# Patient Record
Sex: Female | Born: 1960
Health system: Southern US, Community
[De-identification: ages and names within clinical notes are randomized; demographics above are authoritative.]

## PROBLEM LIST (undated history)

## (undated) DIAGNOSIS — M5136 Other intervertebral disc degeneration, lumbar region: Secondary | ICD-10-CM

## (undated) DIAGNOSIS — M1990 Unspecified osteoarthritis, unspecified site: Secondary | ICD-10-CM

## (undated) DIAGNOSIS — T7840XA Allergy, unspecified, initial encounter: Secondary | ICD-10-CM

## (undated) DIAGNOSIS — M5126 Other intervertebral disc displacement, lumbar region: Secondary | ICD-10-CM

## (undated) DIAGNOSIS — H9190 Unspecified hearing loss, unspecified ear: Secondary | ICD-10-CM

## (undated) DIAGNOSIS — E785 Hyperlipidemia, unspecified: Secondary | ICD-10-CM

## (undated) DIAGNOSIS — M51369 Other intervertebral disc degeneration, lumbar region without mention of lumbar back pain or lower extremity pain: Secondary | ICD-10-CM

## (undated) HISTORY — DX: Other intervertebral disc degeneration, lumbar region: M51.36

## (undated) HISTORY — DX: Other intervertebral disc displacement, lumbar region: M51.26

## (undated) HISTORY — PX: TONSILLECTOMY AND ADENOIDECTOMY: SHX28

## (undated) HISTORY — DX: Allergy, unspecified, initial encounter: T78.40XA

## (undated) HISTORY — DX: Unspecified osteoarthritis, unspecified site: M19.90

## (undated) HISTORY — DX: Hyperlipidemia, unspecified: E78.5

## (undated) HISTORY — DX: Unspecified hearing loss, unspecified ear: H91.90

## (undated) HISTORY — DX: Other intervertebral disc degeneration, lumbar region without mention of lumbar back pain or lower extremity pain: M51.369

---

## 1999-07-08 ENCOUNTER — Other Ambulatory Visit: Admission: RE | Admit: 1999-07-08 | Discharge: 1999-07-08 | Payer: Self-pay | Admitting: *Deleted

## 2000-04-20 ENCOUNTER — Encounter: Payer: Self-pay | Admitting: Emergency Medicine

## 2000-04-20 ENCOUNTER — Encounter: Admission: RE | Admit: 2000-04-20 | Discharge: 2000-04-20 | Payer: Self-pay | Admitting: Emergency Medicine

## 2000-07-21 ENCOUNTER — Other Ambulatory Visit: Admission: RE | Admit: 2000-07-21 | Discharge: 2000-07-21 | Payer: Self-pay | Admitting: *Deleted

## 2001-07-27 ENCOUNTER — Other Ambulatory Visit: Admission: RE | Admit: 2001-07-27 | Discharge: 2001-07-27 | Payer: Self-pay | Admitting: *Deleted

## 2002-07-12 ENCOUNTER — Other Ambulatory Visit: Admission: RE | Admit: 2002-07-12 | Discharge: 2002-07-12 | Payer: Self-pay | Admitting: Obstetrics and Gynecology

## 2003-08-22 ENCOUNTER — Other Ambulatory Visit: Admission: RE | Admit: 2003-08-22 | Discharge: 2003-08-22 | Payer: Self-pay | Admitting: Obstetrics and Gynecology

## 2004-03-11 ENCOUNTER — Encounter: Admission: RE | Admit: 2004-03-11 | Discharge: 2004-03-11 | Payer: Self-pay | Admitting: Emergency Medicine

## 2004-10-02 ENCOUNTER — Other Ambulatory Visit: Admission: RE | Admit: 2004-10-02 | Discharge: 2004-10-02 | Payer: Self-pay | Admitting: Obstetrics and Gynecology

## 2005-10-27 ENCOUNTER — Other Ambulatory Visit: Admission: RE | Admit: 2005-10-27 | Discharge: 2005-10-27 | Payer: Self-pay | Admitting: Obstetrics and Gynecology

## 2006-11-12 ENCOUNTER — Other Ambulatory Visit: Admission: RE | Admit: 2006-11-12 | Discharge: 2006-11-12 | Payer: Self-pay | Admitting: Obstetrics and Gynecology

## 2007-12-28 ENCOUNTER — Other Ambulatory Visit: Admission: RE | Admit: 2007-12-28 | Discharge: 2007-12-28 | Payer: Self-pay | Admitting: Obstetrics and Gynecology

## 2009-04-01 ENCOUNTER — Other Ambulatory Visit: Admission: RE | Admit: 2009-04-01 | Discharge: 2009-04-01 | Payer: Self-pay | Admitting: Obstetrics and Gynecology

## 2010-04-02 LAB — HM PAP SMEAR

## 2012-02-02 DIAGNOSIS — M1990 Unspecified osteoarthritis, unspecified site: Secondary | ICD-10-CM | POA: Insufficient documentation

## 2012-02-02 DIAGNOSIS — M533 Sacrococcygeal disorders, not elsewhere classified: Secondary | ICD-10-CM | POA: Insufficient documentation

## 2013-05-22 LAB — HM MAMMOGRAPHY

## 2013-05-23 ENCOUNTER — Telehealth: Payer: Self-pay | Admitting: *Deleted

## 2013-05-23 NOTE — Telephone Encounter (Signed)
See chart in cabinet for signature for [facility] / sue

## 2013-05-31 ENCOUNTER — Telehealth: Payer: Self-pay | Admitting: *Deleted

## 2013-05-31 NOTE — Telephone Encounter (Signed)
Did she have her follow up?  Do we have the report?

## 2013-05-31 NOTE — Telephone Encounter (Signed)
Left message on [facility] records to send or notify us if patient had additional imaging evaluation of left breast or prior mammogram had been received for additonal comparison. To fax report if has been done.

## 2013-05-31 NOTE — Telephone Encounter (Signed)
Results from [facility] on HOLD per Dr. Bryson Ha

## 2013-09-08 ENCOUNTER — Ambulatory Visit (INDEPENDENT_AMBULATORY_CARE_PROVIDER_SITE_OTHER): Payer: BC Managed Care – PPO | Admitting: Obstetrics and Gynecology

## 2013-09-08 ENCOUNTER — Ambulatory Visit: Payer: Self-pay | Admitting: Obstetrics and Gynecology

## 2013-09-08 ENCOUNTER — Encounter: Payer: Self-pay | Admitting: Obstetrics and Gynecology

## 2013-09-08 VITALS — BP 118/70 | HR 64 | Ht 66.5 in | Wt 136.5 lb

## 2013-09-08 DIAGNOSIS — Z0000 Encounter for general adult medical examination without abnormal findings: Secondary | ICD-10-CM

## 2013-09-08 DIAGNOSIS — Z01419 Encounter for gynecological examination (general) (routine) without abnormal findings: Secondary | ICD-10-CM

## 2013-09-08 LAB — POCT URINALYSIS DIPSTICK
Bilirubin, UA: NEGATIVE
Blood, UA: NEGATIVE
Ketones, UA: NEGATIVE
Leukocytes, UA: NEGATIVE
Protein, UA: NEGATIVE
pH, UA: 5

## 2013-09-08 LAB — HEMOGLOBIN, FINGERSTICK: Hemoglobin, fingerstick: 13.4 g/dL (ref 12.0–16.0)

## 2013-09-08 LAB — COMPREHENSIVE METABOLIC PANEL
ALT: 15 U/L (ref 0–35)
AST: 19 U/L (ref 0–37)
Alkaline Phosphatase: 42 U/L (ref 39–117)
CO2: 28 mEq/L (ref 19–32)
Creat: 0.81 mg/dL (ref 0.50–1.10)
Glucose, Bld: 88 mg/dL (ref 70–99)
Sodium: 137 mEq/L (ref 135–145)
Total Bilirubin: 0.6 mg/dL (ref 0.3–1.2)
Total Protein: 7.1 g/dL (ref 6.0–8.3)

## 2013-09-08 LAB — CBC
HCT: 41 % (ref 36.0–46.0)
MCH: 28.7 pg (ref 26.0–34.0)
MCHC: 33.7 g/dL (ref 30.0–36.0)
MCV: 85.2 fL (ref 78.0–100.0)
RDW: 13.7 % (ref 11.5–15.5)

## 2013-09-08 LAB — LIPID PANEL
HDL: 86 mg/dL (ref >39)
LDL Cholesterol: 122 mg/dL — ABNORMAL HIGH (ref 0–99)
Triglycerides: 91 mg/dL (ref <150)
VLDL: 18 mg/dL (ref 0–40)

## 2013-09-08 NOTE — Progress Notes (Signed)
Patient ID: Dawn Bonilla, female   DOB: [birth year], 52 y.o.   MRN: [id number] GYNECOLOGY VISIT  PCP:  Mila Palmer, MD  Referring provider:   HPI: 52 y.o.   Married  Caucasian  female   G2P2002 with Patient's last menstrual period was 04/24/2011.   here for   AEX. No HRT use second to family history of breast cancer. Just a few night sweats now.  Some vaginal dryness - not significant.   See Ortho for L4 and L5 disc bulging.   Hgb:    13.4 Urine:  Neg  GYNECOLOGIC HISTORY: Patient's last menstrual period was 04/24/2011. Sexually active:  yes Partner preference: female Contraception:   Vasectomy/postmenopausal Menopausal hormone therapy: no DES exposure:   no Blood transfusions:   no Sexually transmitted diseases:  no  GYN Procedures:  C-section 1995   Mammogram:  05-24-13 asymmetric density related to artifact.  Repeat mammogram 1 year.(hx left breast with focal asymmetric density just medial to nipple line.  Had additional views and felt related to artifact)              Pap: 04-02-10 wnl   History of abnormal pap smear:  no   OB History   Grav Para Term Preterm Abortions TAB SAB Ect Mult Living   2 2 2       2        LIFESTYLE: Exercise:      Cardio, yoga and weight training        Tobacco:      no Alcohol:         3 drinks per week Drug use:      no  OTHER HEALTH MAINTENANCE: Tetanus/TDap:  2007 Gardisil:   NA Influenza:    07/2012 Zostavax:    no  Bone density:  never Colonoscopy:  2011 NWG:NFAO due 2016 with [facility]  Cholesterol check: 2009 wnl  Family History  Problem Relation Age of Onset  . Hypertension Mother   . Breast cancer Mother 22  . Diabetes Mother     AODM  . Lymphoma Mother   . Hypertension Father   . Heart disease Father   . Hypertension Brother   . Heart attack Brother 56  . Breast cancer Sister 41  . Osteoporosis Sister   . Breast cancer Maternal Grandmother 80  . Diabetes Maternal Grandmother     There are no active problems  to display for this patient.  Past Medical History  Diagnosis Date  . Bulging lumbar disc     Past Surgical History  Procedure Laterality Date  . Tonsillectomy and adenoidectomy      age 72  . Cesarean section  1995    ALLERGIES: Naproxen  Current Outpatient Prescriptions  Medication Sig Dispense Refill  . calcium carbonate (OS-CAL) 600 MG TABS tablet Take 600 mg by mouth 2 (two) times daily with a meal.      . cholecalciferol (VITAMIN D) 1000 UNITS tablet Take 1,000 Units by mouth daily.      . diclofenac (VOLTAREN) 75 MG EC tablet Take 75 mg by mouth as needed.      Marland Kitchen ibuprofen (ADVIL,MOTRIN) 200 MG tablet Take 200 mg by mouth every 6 (six) hours as needed for pain.      . Multiple Vitamin (MULTIVITAMIN) capsule Take 1 capsule by mouth daily.       No current facility-administered medications for this visit.     ROS:  Pertinent items are noted in HPI.  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  2 grown children.   PHYSICAL EXAMINATION:    BP 118/70  Pulse 64  Ht 5' 6.5" (1.689 m)  Wt 136 lb 8 oz (61.916 kg)  BMI 21.7 kg/m2  LMP 04/24/2011   Wt Readings from Last 3 Encounters:  09/08/13 136 lb 8 oz (61.916 kg)     Ht Readings from Last 3 Encounters:  09/08/13 5' 6.5" (1.689 m)    General appearance: alert, cooperative and appears stated age Head: Normocephalic, without obvious abnormality, atraumatic Neck: no adenopathy, supple, symmetrical, trachea midline and thyroid not enlarged, symmetric, no tenderness/mass/nodules Lungs: clear to auscultation bilaterally Breasts: Inspection negative, No nipple retraction or dimpling, No nipple discharge or bleeding, No axillary or supraclavicular adenopathy, Normal to palpation without dominant masses Heart: regular rate and rhythm Abdomen: soft, non-tender; no masses,  no organomegaly Extremities: extremities normal, atraumatic, no cyanosis or edema Skin: Skin color, texture,  turgor normal. No rashes or lesions Lymph nodes: Cervical, supraclavicular, and axillary nodes normal. No abnormal inguinal nodes palpated Neurologic: Grossly normal  Pelvic: External genitalia:  no lesions              Urethra:  normal appearing urethra with no masses, tenderness or lesions              Bartholins and Skenes: normal                 Vagina: normal appearing vagina with normal color and discharge, no lesions              Cervix: normal appearance              Pap and high risk HPV testing done: yes.            Bimanual Exam:  Uterus:  uterus is normal size, shape, consistency and nontender                                      Adnexa: normal adnexa in size, nontender and no masses                                      Rectovaginal: Confirms                                      Anus:  normal sphincter tone, no lesions  ASSESSMENT  Normal gynecologic exam.  PLAN  Mammogram July 2014.  Pap smear and high risk HPV testing Counseled on treatments for vaginal dryness.  Patient will pursue vitamin E for the vagina. FLP, CBC, CMP, Vit D level. Return annually or prn   An After Visit Summary was printed and given to the patient.

## 2013-09-08 NOTE — Patient Instructions (Signed)

## 2013-09-12 LAB — IPS PAP TEST WITH HPV

## 2013-09-28 ENCOUNTER — Other Ambulatory Visit: Payer: Self-pay

## 2014-08-07 ENCOUNTER — Encounter: Payer: Self-pay | Admitting: Obstetrics and Gynecology

## 2014-09-10 ENCOUNTER — Ambulatory Visit: Payer: BC Managed Care – PPO | Admitting: Obstetrics and Gynecology

## 2014-09-21 ENCOUNTER — Ambulatory Visit: Payer: BC Managed Care – PPO | Admitting: Obstetrics and Gynecology

## 2014-09-24 ENCOUNTER — Encounter: Payer: Self-pay | Admitting: Obstetrics and Gynecology

## 2014-09-26 ENCOUNTER — Encounter: Payer: Self-pay | Admitting: Obstetrics and Gynecology

## 2014-09-26 ENCOUNTER — Ambulatory Visit (INDEPENDENT_AMBULATORY_CARE_PROVIDER_SITE_OTHER): Payer: BC Managed Care – PPO | Admitting: Obstetrics and Gynecology

## 2014-09-26 VITALS — BP 118/80 | HR 72 | Resp 18 | Ht 66.0 in | Wt 138.0 lb

## 2014-09-26 DIAGNOSIS — Z01419 Encounter for gynecological examination (general) (routine) without abnormal findings: Secondary | ICD-10-CM

## 2014-09-26 NOTE — Patient Instructions (Signed)

## 2014-09-26 NOTE — Progress Notes (Signed)
53 y.o. [id number] MarriedCaucasianF here for annual exam.    Will do labs with PCP.  Had not seen PCP in 5 years.   No hot flashes.  No bleeding.  Some vaginal dryness.   Patient's last menstrual period was 04/24/2011.          Sexually active: Yes.    The current method of family planning is post menopausal status.    Exercising: Yes.    Cardio, Weights training, pilates, yoga 5 - 6 x weekly Smoker:  no  Health Maintenance: Pap: 08/2013 Neg. HR HPV: neg History of abnormal Pap:  no MMG: 06/2014 BIRADS2: benign  Colonoscopy:  2010 BMD:   N/A TDaP: 2007 Screening Labs: PCP, Hb today: PCP, Urine today: PCP   reports that she has never smoked. She has never used smokeless tobacco. She reports that she drinks about 3.6 oz of alcohol per week. She reports that she does not use illicit drugs.  Past Medical History  Diagnosis Date  . Bulging lumbar disc     Past Surgical History  Procedure Laterality Date  . Tonsillectomy and adenoidectomy      age 53  . Cesarean section  1995    Current Outpatient Prescriptions  Medication Sig Dispense Refill  . calcium carbonate (OS-CAL) 600 MG TABS tablet Take 600 mg by mouth 2 (two) times daily with a meal.    . ibuprofen (ADVIL,MOTRIN) 200 MG tablet Take 200 mg by mouth every 6 (six) hours as needed for pain.    . Multiple Vitamin (MULTIVITAMIN) capsule Take 1 capsule by mouth daily.     No current facility-administered medications for this visit.    Family History  Problem Relation Age of Onset  . Hypertension Mother   . Breast cancer Mother 6050  . Diabetes Mother     AODM  . Lymphoma Mother   . Hypertension Father   . Heart disease Father   . Hypertension Brother   . Heart attack Brother 56  . Breast cancer Sister [id number]  . Osteoporosis Sister   . Breast cancer Maternal Grandmother 80  . Diabetes Maternal Grandmother     ROS:  Pertinent items are noted in HPI.  Otherwise, a comprehensive ROS was negative.  Exam:   BP 118/80  mmHg  Pulse 72  Resp 18  Ht 5\' 6"  (1.676 m)  Wt 138 lb (62.596 kg)  BMI 22.28 kg/m2  LMP 04/24/2011     Height: 5\' 6"  (167.6 cm)  Ht Readings from Last 3 Encounters:  09/26/14 5\' 6"  (1.676 m)  09/08/13 5' 6.5" (1.689 m)    General appearance: alert, cooperative and appears stated age Head: Normocephalic, without obvious abnormality, atraumatic Neck: no adenopathy, supple, symmetrical, trachea midline and thyroid normal to inspection and palpation Lungs: clear to auscultation bilaterally Breasts: normal appearance, no masses or tenderness, Inspection negative, No nipple retraction or dimpling, No nipple discharge or bleeding, No axillary or supraclavicular adenopathy Heart: regular rate and rhythm Abdomen: soft, non-tender; bowel sounds normal; no masses,  no organomegaly Extremities: extremities normal, atraumatic, no cyanosis or edema Skin: Skin color, texture, turgor normal. No rashes or lesions Lymph nodes: Cervical, supraclavicular, and axillary nodes normal. No abnormal inguinal nodes palpated Neurologic: Grossly normal   Pelvic: External genitalia:  no lesions              Urethra:  normal appearing urethra with no masses, tenderness or lesions              Bartholins  and Skenes: normal                 Vagina: normal appearing vagina with normal color and discharge, no lesions              Cervix: no lesions              Pap taken: No. Bimanual Exam:  Uterus:  normal size, contour, position, consistency, mobility, non-tender              Adnexa: normal adnexa and no mass, fullness, tenderness               Rectovaginal: Confirms               Anus:  normal sphincter tone, no lesions  A:  Well Woman with normal exam Family history of breast cancer.   P:   Mammogram.  Discussed 3D. pap smear not indicated.  Labs with PCP.  Discussed Ca/Vit D. return annually or prn  An After Visit Summary was printed and given to the patient.

## 2015-12-12 ENCOUNTER — Encounter: Payer: Self-pay | Admitting: Obstetrics and Gynecology

## 2015-12-12 ENCOUNTER — Ambulatory Visit (INDEPENDENT_AMBULATORY_CARE_PROVIDER_SITE_OTHER): Payer: BLUE CROSS/BLUE SHIELD | Admitting: Obstetrics and Gynecology

## 2015-12-12 VITALS — BP 110/70 | HR 80 | Resp 14 | Ht 65.75 in | Wt 140.0 lb

## 2015-12-12 DIAGNOSIS — Z01419 Encounter for gynecological examination (general) (routine) without abnormal findings: Secondary | ICD-10-CM | POA: Diagnosis not present

## 2015-12-12 DIAGNOSIS — Z0000 Encounter for general adult medical examination without abnormal findings: Secondary | ICD-10-CM | POA: Diagnosis not present

## 2015-12-12 DIAGNOSIS — Z23 Encounter for immunization: Secondary | ICD-10-CM

## 2015-12-12 LAB — POCT URINALYSIS DIPSTICK
Bilirubin, UA: NEGATIVE
Blood, UA: NEGATIVE
Glucose, UA: NEGATIVE
Ketones, UA: NEGATIVE
Leukocytes, UA: NEGATIVE
Nitrite, UA: NEGATIVE
Protein, UA: NEGATIVE
Urobilinogen, UA: NEGATIVE
pH, UA: 7

## 2015-12-12 NOTE — Progress Notes (Signed)
Patient ID: Dawn Bonilla, female   DOB: 05/21/1961, 55 y.o.   MRN: [id number] 55 y.o. G61P2002 Married Caucasian female here for annual exam.    Pain with intercourse.  Vaginal dryness.   FH of breast cancer - sister, mother, and maternal grandmother.  Sister did genetic testing which was negative.   PCP:  Dr. Johnn Hai - labs with PCP.   Patient's last menstrual period was 04/24/2011.          Sexually active: Yes.    The current method of family planning is post menopausal status.    Exercising: Yes.    yoga, pilates, walking, weight training and cardio Smoker:  no  Health Maintenance: Pap:  09-08-13 WNL NEG HR HPV History of abnormal Pap:  no MMG:  07-17-14 WNL BI RADS 2 heterogeneously dense breast.  States she believes she did mammogram in 2016.  Colonoscopy:  07-24-10 WNL BMD:   Never TDaP:  2007 Screening Labs:  Hb today: PCP does labs, Urine today:  Normal.   reports that she has never smoked. She has never used smokeless tobacco. She reports that she drinks about 3.6 oz of alcohol per week. She reports that she does not use illicit drugs.  Past Medical History  Diagnosis Date  . Bulging lumbar disc     Past Surgical History  Procedure Laterality Date  . Tonsillectomy and adenoidectomy      age 49  . Cesarean section  1995    Current Outpatient Prescriptions  Medication Sig Dispense Refill  . calcium carbonate (OS-CAL) 600 MG TABS tablet Take 600 mg by mouth 2 (two) times daily with a meal.    . ibuprofen (ADVIL,MOTRIN) 200 MG tablet Take 200 mg by mouth every 6 (six) hours as needed for pain.    . Multiple Vitamin (MULTIVITAMIN) capsule Take 1 capsule by mouth daily.     No current facility-administered medications for this visit.    Family History  Problem Relation Age of Onset  . Hypertension Mother   . Breast cancer Mother 36  . Diabetes Mother     AODM  . Lymphoma Mother   . Hypertension Father   . Heart disease Father   . Hypertension Brother    . Heart attack Brother 56  . Breast cancer Sister 17  . Osteoporosis Sister   . Breast cancer Maternal Grandmother 80  . Diabetes Maternal Grandmother     ROS:  Pertinent items are noted in HPI.  Otherwise, a comprehensive ROS was negative.  Exam:   BP 110/70 mmHg  Pulse 80  Resp 14  Ht 5' 5.75" (1.67 m)  Wt 140 lb (63.504 kg)  BMI 22.77 kg/m2  LMP 04/24/2011    General appearance: alert, cooperative and appears stated age Head: Normocephalic, without obvious abnormality, atraumatic Neck: no adenopathy, supple, symmetrical, trachea midline and thyroid normal to inspection and palpation Lungs: clear to auscultation bilaterally Breasts: normal appearance, no masses or tenderness, Inspection negative, No nipple retraction or dimpling, No nipple discharge or bleeding, No axillary or supraclavicular adenopathy Heart: regular rate and rhythm Abdomen: soft, non-tender; bowel sounds normal; no masses,  no organomegaly Extremities: extremities normal, atraumatic, no cyanosis or edema Skin: Skin color, texture, turgor normal. No rashes or lesions Lymph nodes: Cervical, supraclavicular, and axillary nodes normal. No abnormal inguinal nodes palpated Neurologic: Grossly normal  Pelvic: External genitalia:  no lesions              Urethra:  normal appearing urethra with  no masses, tenderness or lesions              Bartholins and Skenes: normal                 Vagina: normal appearing vagina with normal color and discharge, no lesions.  Mucosa somewhat pale.              Cervix: no lesions              Pap taken: Yes.   Bimanual Exam:  Uterus:  normal size, contour, position, consistency, mobility, non-tender              Adnexa: normal adnexa and no mass, fullness, tenderness              Rectovaginal: Yes.  .  Confirms.              Anus:  normal sphincter tone, no lesions  Chaperone was present for exam.  Assessment:   Well woman visit with normal exam. FH of breast cancer.   Negative genetic testing in sister. Vaginal atrophy.   Plan: Yearly mammogram recommended after age 34.  Discussed 3D.  Will get report form 2016 mammogram. Recommended self breast exam.  Pap and HR HPV as above. Discussed Calcium, Vitamin D, regular exercise program including cardiovascular and weight bearing exercise. Labs performed.  No..   See orders. Refills given on medications.  No..  See orders. Discussed options for vagina atrophy - water based products, cooking oils, and vaginal local estrogens.  Discussed potential risks of DTV, PE, MI, stroke, and breast cancer.  Patient declines vaginal estrogen treatment.  TDap today.  Follow up annually and prn.      After visit summary provided.

## 2015-12-12 NOTE — Progress Notes (Signed)
Patient here for AEX with Dr. Edward Jolly.  She is due her Tdap booster and was advised on benefits and side effects.  Patient agreed for administration on right deltiod.  She denies any previous complications with injections.  She has no other other questions and tolerated the injection well.

## 2015-12-16 LAB — IPS PAP TEST WITH HPV

## 2016-02-24 DIAGNOSIS — M545 Low back pain: Secondary | ICD-10-CM | POA: Diagnosis not present

## 2016-02-24 DIAGNOSIS — M47817 Spondylosis without myelopathy or radiculopathy, lumbosacral region: Secondary | ICD-10-CM | POA: Diagnosis not present

## 2016-02-28 DIAGNOSIS — M545 Low back pain: Secondary | ICD-10-CM | POA: Diagnosis not present

## 2016-02-28 DIAGNOSIS — M47817 Spondylosis without myelopathy or radiculopathy, lumbosacral region: Secondary | ICD-10-CM | POA: Diagnosis not present

## 2016-03-16 DIAGNOSIS — M9905 Segmental and somatic dysfunction of pelvic region: Secondary | ICD-10-CM | POA: Diagnosis not present

## 2016-03-16 DIAGNOSIS — M9903 Segmental and somatic dysfunction of lumbar region: Secondary | ICD-10-CM | POA: Diagnosis not present

## 2016-03-16 DIAGNOSIS — M7062 Trochanteric bursitis, left hip: Secondary | ICD-10-CM | POA: Diagnosis not present

## 2016-03-23 DIAGNOSIS — M47817 Spondylosis without myelopathy or radiculopathy, lumbosacral region: Secondary | ICD-10-CM | POA: Diagnosis not present

## 2016-03-23 DIAGNOSIS — M545 Low back pain: Secondary | ICD-10-CM | POA: Diagnosis not present

## 2016-04-09 DIAGNOSIS — M9903 Segmental and somatic dysfunction of lumbar region: Secondary | ICD-10-CM | POA: Diagnosis not present

## 2016-04-09 DIAGNOSIS — M7062 Trochanteric bursitis, left hip: Secondary | ICD-10-CM | POA: Diagnosis not present

## 2016-04-09 DIAGNOSIS — M9905 Segmental and somatic dysfunction of pelvic region: Secondary | ICD-10-CM | POA: Diagnosis not present

## 2016-05-11 DIAGNOSIS — M9905 Segmental and somatic dysfunction of pelvic region: Secondary | ICD-10-CM | POA: Diagnosis not present

## 2016-05-11 DIAGNOSIS — M9903 Segmental and somatic dysfunction of lumbar region: Secondary | ICD-10-CM | POA: Diagnosis not present

## 2016-05-11 DIAGNOSIS — M7062 Trochanteric bursitis, left hip: Secondary | ICD-10-CM | POA: Diagnosis not present

## 2016-06-05 DIAGNOSIS — M9903 Segmental and somatic dysfunction of lumbar region: Secondary | ICD-10-CM | POA: Diagnosis not present

## 2016-06-05 DIAGNOSIS — M7062 Trochanteric bursitis, left hip: Secondary | ICD-10-CM | POA: Diagnosis not present

## 2016-06-05 DIAGNOSIS — M9905 Segmental and somatic dysfunction of pelvic region: Secondary | ICD-10-CM | POA: Diagnosis not present

## 2016-06-18 DIAGNOSIS — M1812 Unilateral primary osteoarthritis of first carpometacarpal joint, left hand: Secondary | ICD-10-CM | POA: Diagnosis not present

## 2016-07-06 DIAGNOSIS — M7062 Trochanteric bursitis, left hip: Secondary | ICD-10-CM | POA: Diagnosis not present

## 2016-07-06 DIAGNOSIS — M9905 Segmental and somatic dysfunction of pelvic region: Secondary | ICD-10-CM | POA: Diagnosis not present

## 2016-07-06 DIAGNOSIS — M9903 Segmental and somatic dysfunction of lumbar region: Secondary | ICD-10-CM | POA: Diagnosis not present

## 2016-07-15 DIAGNOSIS — H903 Sensorineural hearing loss, bilateral: Secondary | ICD-10-CM | POA: Diagnosis not present

## 2016-07-15 DIAGNOSIS — H9311 Tinnitus, right ear: Secondary | ICD-10-CM | POA: Diagnosis not present

## 2016-07-16 ENCOUNTER — Ambulatory Visit (INDEPENDENT_AMBULATORY_CARE_PROVIDER_SITE_OTHER): Payer: BLUE CROSS/BLUE SHIELD

## 2016-07-16 ENCOUNTER — Ambulatory Visit (INDEPENDENT_AMBULATORY_CARE_PROVIDER_SITE_OTHER): Payer: BLUE CROSS/BLUE SHIELD | Admitting: Podiatry

## 2016-07-16 ENCOUNTER — Other Ambulatory Visit: Payer: Self-pay | Admitting: Otolaryngology

## 2016-07-16 ENCOUNTER — Encounter: Payer: Self-pay | Admitting: Podiatry

## 2016-07-16 VITALS — BP 108/71 | HR 75 | Resp 16 | Ht 66.5 in | Wt 140.0 lb

## 2016-07-16 DIAGNOSIS — M21619 Bunion of unspecified foot: Secondary | ICD-10-CM

## 2016-07-16 DIAGNOSIS — M79671 Pain in right foot: Secondary | ICD-10-CM

## 2016-07-16 DIAGNOSIS — H903 Sensorineural hearing loss, bilateral: Secondary | ICD-10-CM

## 2016-07-16 DIAGNOSIS — M205X1 Other deformities of toe(s) (acquired), right foot: Secondary | ICD-10-CM | POA: Diagnosis not present

## 2016-07-16 DIAGNOSIS — H9311 Tinnitus, right ear: Secondary | ICD-10-CM

## 2016-07-16 NOTE — Progress Notes (Signed)
   Subjective:    Patient ID: Dawn Bonilla, female    DOB: 07-30-1961, 55 y.o.   MRN: [id number]  HPI Chief Complaint  Patient presents with  . Foot Pain    Right foot; great toe-base; pt stated, "Feels like has a knot on foot"; x2-3 months      Review of Systems  All other systems reviewed and are negative.      Objective:   Physical Exam        Assessment & Plan:

## 2016-07-17 NOTE — Progress Notes (Signed)
Subjective:     Patient ID: Dawn Bonilla, female   DOB: May 14, 1961, 55 y.o.   MRN: [id number]  HPI patient presents stating she's been getting increased pain in her big toe joint right with spur formation and structural bunion and she states that she's tried wider shoes she's tried other modalities without relief of symptoms   Review of Systems  All other systems reviewed and are negative.      Objective:   Physical Exam  Constitutional: She is oriented to person, place, and time.  Cardiovascular: Intact distal pulses.   Musculoskeletal: Normal range of motion.  Neurological: She is oriented to person, place, and time.  Skin: Skin is warm.  Nursing note and vitals reviewed.  neurovascular status found to be intact muscle strength adequate range of motion within normal limits with patient noted to have redness and spurring around the big toe joint right with reduced range of motion and mild crepitus. It is moderately painful when pressed and localized in nature to the joint surface. Patient's found have good digital perfusion and is well oriented 3     Assessment:     Inflammatory hallux limitus deformity right with fluid buildup and pain of the joint surface    Plan:     H&P and x-rays reviewed and today we discussed the problem and treatment options. She would like a surgical option for this to have recommended removal of spur with shortening of the bone with osteotomy and reviewed possible damage to the cartilage which may not be repairable. Patient will be seen back for us to recheck and discussed in greater detail  X-ray results indicated there is spur of the first metatarsal head right with narrowing of the joint surface and mild elevation

## 2016-07-21 DIAGNOSIS — Z803 Family history of malignant neoplasm of breast: Secondary | ICD-10-CM | POA: Diagnosis not present

## 2016-07-21 DIAGNOSIS — Z1231 Encounter for screening mammogram for malignant neoplasm of breast: Secondary | ICD-10-CM | POA: Diagnosis not present

## 2016-07-23 DIAGNOSIS — R922 Inconclusive mammogram: Secondary | ICD-10-CM | POA: Diagnosis not present

## 2016-07-23 DIAGNOSIS — R928 Other abnormal and inconclusive findings on diagnostic imaging of breast: Secondary | ICD-10-CM | POA: Diagnosis not present

## 2016-07-23 DIAGNOSIS — Z803 Family history of malignant neoplasm of breast: Secondary | ICD-10-CM | POA: Diagnosis not present

## 2016-07-26 ENCOUNTER — Ambulatory Visit
Admission: RE | Admit: 2016-07-26 | Discharge: 2016-07-26 | Disposition: A | Discharge status: Home / Self Care | Payer: Self-pay | Source: Ambulatory Visit | Attending: Otolaryngology | Admitting: Otolaryngology

## 2016-07-26 DIAGNOSIS — H903 Sensorineural hearing loss, bilateral: Secondary | ICD-10-CM | POA: Diagnosis not present

## 2016-07-26 DIAGNOSIS — H9311 Tinnitus, right ear: Secondary | ICD-10-CM

## 2016-07-26 MED ORDER — GADOBENATE DIMEGLUMINE 529 MG/ML IV SOLN
13.0000 mL | Freq: Once | INTRAVENOUS | Status: AC | PRN
  Administered 2016-07-26: 13 mL via INTRAVENOUS

## 2016-07-29 DIAGNOSIS — E559 Vitamin D deficiency, unspecified: Secondary | ICD-10-CM | POA: Diagnosis not present

## 2016-07-29 DIAGNOSIS — Z23 Encounter for immunization: Secondary | ICD-10-CM | POA: Diagnosis not present

## 2016-07-29 DIAGNOSIS — Z0000 Encounter for general adult medical examination without abnormal findings: Secondary | ICD-10-CM | POA: Diagnosis not present

## 2016-07-29 DIAGNOSIS — E785 Hyperlipidemia, unspecified: Secondary | ICD-10-CM | POA: Diagnosis not present

## 2016-07-29 DIAGNOSIS — M2550 Pain in unspecified joint: Secondary | ICD-10-CM | POA: Diagnosis not present

## 2016-07-30 ENCOUNTER — Ambulatory Visit (INDEPENDENT_AMBULATORY_CARE_PROVIDER_SITE_OTHER): Payer: BLUE CROSS/BLUE SHIELD | Admitting: Podiatry

## 2016-07-30 ENCOUNTER — Encounter: Payer: Self-pay | Admitting: Podiatry

## 2016-07-30 DIAGNOSIS — M205X1 Other deformities of toe(s) (acquired), right foot: Secondary | ICD-10-CM

## 2016-07-30 DIAGNOSIS — M21619 Bunion of unspecified foot: Secondary | ICD-10-CM

## 2016-07-30 NOTE — Patient Instructions (Signed)

## 2016-07-31 NOTE — Progress Notes (Signed)
Subjective:     Patient ID: Dawn Bonilla, female   DOB: 24-Sep-1961, 55 y.o.   MRN: [id number]  HPI patient presents stating she has a bunion and she needs to have it fixed   Review of Systems     Objective:   Physical Exam Neurovascular status intact muscle strength adequate patient found to have hyperostosis medial aspect first metatarsal head left that's painful and red when pressed and makes wearing shoe gear difficult    Assessment:     Structural HAV deformity left with pain    Plan:     Reviewed condition and treatments and she wants it fixed and I allowed her to read a consent form for correction going over all possible complications and alternative treatments as listed and the fact total recovery will take 6 months to one year. After extensive review she signed consent form is given all preoperative instructions and is dispensed air fracture walker to use prior to surgery to get used to and for the postoperative period

## 2016-08-10 DIAGNOSIS — M9903 Segmental and somatic dysfunction of lumbar region: Secondary | ICD-10-CM | POA: Diagnosis not present

## 2016-08-10 DIAGNOSIS — M9905 Segmental and somatic dysfunction of pelvic region: Secondary | ICD-10-CM | POA: Diagnosis not present

## 2016-08-10 DIAGNOSIS — M7062 Trochanteric bursitis, left hip: Secondary | ICD-10-CM | POA: Diagnosis not present

## 2016-08-11 ENCOUNTER — Encounter: Payer: Self-pay | Admitting: Podiatry

## 2016-08-11 DIAGNOSIS — M21611 Bunion of right foot: Secondary | ICD-10-CM | POA: Diagnosis not present

## 2016-08-11 DIAGNOSIS — M7731 Calcaneal spur, right foot: Secondary | ICD-10-CM | POA: Diagnosis not present

## 2016-08-11 DIAGNOSIS — M2011 Hallux valgus (acquired), right foot: Secondary | ICD-10-CM | POA: Diagnosis not present

## 2016-08-11 DIAGNOSIS — M1380 Other specified arthritis, unspecified site: Secondary | ICD-10-CM | POA: Diagnosis not present

## 2016-08-11 DIAGNOSIS — M2020 Hallux rigidus, unspecified foot: Secondary | ICD-10-CM | POA: Diagnosis not present

## 2016-08-13 ENCOUNTER — Telehealth: Payer: Self-pay | Admitting: *Deleted

## 2016-08-13 NOTE — Telephone Encounter (Signed)
Pt's husband, Dawn MayansBob Bonilla states he is concerned pt will not have enough pain medication to get through the weekend, and has an appt tomorrow.  I spoke with pt and she states she felt she let too much time go between the doses of the narcotic pain medication. I told pt that could be a problem and if she needed more pain coverage to take 2 Ibuprofen 200mg  between the dosing of the pain medication. I also encouraged pt not to be weight bearing, dangling or have the surgical foot below her heart more than 15 mins/hour, remain in the boot at all times, and keep dressing in place clean and dry until the 1POV, and to ask for the pain medication at tomorrow's visit because the script would have to be handcarried to the pharmacy.  Pt states understanding.

## 2016-08-14 ENCOUNTER — Ambulatory Visit (INDEPENDENT_AMBULATORY_CARE_PROVIDER_SITE_OTHER): Payer: BLUE CROSS/BLUE SHIELD

## 2016-08-14 ENCOUNTER — Encounter: Payer: Self-pay | Admitting: Podiatry

## 2016-08-14 ENCOUNTER — Ambulatory Visit (INDEPENDENT_AMBULATORY_CARE_PROVIDER_SITE_OTHER): Payer: BLUE CROSS/BLUE SHIELD | Admitting: Podiatry

## 2016-08-14 VITALS — BP 143/78 | HR 74 | Temp 98.3°F

## 2016-08-14 DIAGNOSIS — M21619 Bunion of unspecified foot: Secondary | ICD-10-CM

## 2016-08-14 DIAGNOSIS — Z9889 Other specified postprocedural states: Secondary | ICD-10-CM

## 2016-08-14 DIAGNOSIS — M205X1 Other deformities of toe(s) (acquired), right foot: Secondary | ICD-10-CM

## 2016-08-14 MED ORDER — OXYCODONE-ACETAMINOPHEN 10-325 MG PO TABS: 1.0000 | ORAL_TABLET | Freq: Four times a day (QID) | ORAL | 0 refills | Status: DC | PRN

## 2016-08-14 NOTE — Progress Notes (Signed)
DOS 09.19.2017 Bi-Planar Osteotomy with Pin Fixation and Removal Bone Spur Right

## 2016-08-16 NOTE — Progress Notes (Signed)
Subjective:     Patient ID: Dawn Bonilla, female   DOB: 03-Mar-1961, 55 y.o.   MRN: [id number]  HPI patient states she's doing real well so far with minimal discomfort and pain   Review of Systems     Objective:   Physical Exam Neurovascular status intact negative Homan sign was noted with well-healed surgical site right first metatarsal with hallux in good alignment and good reduction of the intermetatarsal angle    Assessment:     Doing well post osteotomy first metatarsal    Plan:     H&P x-ray reviewed sterile dressing reapplied advised on continued elevation and utilization of immobilization. Reappoint in approximately 2 weeks or earlier if needed  X-ray report was negative for signs of fracture and indicated good alignment with screws in place

## 2016-08-27 ENCOUNTER — Ambulatory Visit (INDEPENDENT_AMBULATORY_CARE_PROVIDER_SITE_OTHER): Payer: BLUE CROSS/BLUE SHIELD

## 2016-08-27 ENCOUNTER — Ambulatory Visit (INDEPENDENT_AMBULATORY_CARE_PROVIDER_SITE_OTHER): Payer: BLUE CROSS/BLUE SHIELD | Admitting: Podiatry

## 2016-08-27 DIAGNOSIS — M21619 Bunion of unspecified foot: Secondary | ICD-10-CM | POA: Diagnosis not present

## 2016-08-27 DIAGNOSIS — M205X1 Other deformities of toe(s) (acquired), right foot: Secondary | ICD-10-CM

## 2016-08-27 DIAGNOSIS — Z9889 Other specified postprocedural states: Secondary | ICD-10-CM

## 2016-08-27 MED ORDER — CEPHALEXIN 500 MG PO CAPS: 500.0000 mg | ORAL_CAPSULE | Freq: Three times a day (TID) | ORAL | 0 refills | Status: DC

## 2016-08-30 NOTE — Progress Notes (Signed)
Subjective:     Patient ID: Dawn Bonilla, female   DOB: 17-Jan-1961, 55 y.o.   MRN: [id number]  HPI patient states she's doing real well with her right foot but did note a little bit of seepage in the center of it with slight redness that's localized and states she just wanted to get it checked   Review of Systems  All other systems reviewed and are negative.      Objective:   Physical Exam Neurovascular status was found to be intact negative Homans sign was noted with wound edges that are well coapted with a slight bit of gapping in the central portion which is probably due to reaction to stitch. It is localized with no proximal edema erythema drainage noted and no constitutional indications of infection    Assessment:     Localized very slight breakup approximate 1 cm length of the incision site which is most likely related to suture reaction    Plan:     Instructed on soaks and utilization of Steri-Strips along with compression and continued immobilization and range of motion exercises. As precautionary measure placed patient on cephalexin 500 mg and gave strict instructions if any further redness drainage pain or swelling were to occur to contact us immediately  X-ray report was negative for signs of bone movement with good alignment noted

## 2016-09-10 DIAGNOSIS — M7062 Trochanteric bursitis, left hip: Secondary | ICD-10-CM | POA: Diagnosis not present

## 2016-09-10 DIAGNOSIS — M9905 Segmental and somatic dysfunction of pelvic region: Secondary | ICD-10-CM | POA: Diagnosis not present

## 2016-09-10 DIAGNOSIS — M9903 Segmental and somatic dysfunction of lumbar region: Secondary | ICD-10-CM | POA: Diagnosis not present

## 2016-09-17 ENCOUNTER — Ambulatory Visit (INDEPENDENT_AMBULATORY_CARE_PROVIDER_SITE_OTHER): Payer: BLUE CROSS/BLUE SHIELD

## 2016-09-17 ENCOUNTER — Ambulatory Visit (INDEPENDENT_AMBULATORY_CARE_PROVIDER_SITE_OTHER): Payer: BLUE CROSS/BLUE SHIELD | Admitting: Podiatry

## 2016-09-17 DIAGNOSIS — Z9889 Other specified postprocedural states: Secondary | ICD-10-CM

## 2016-09-17 DIAGNOSIS — M205X1 Other deformities of toe(s) (acquired), right foot: Secondary | ICD-10-CM | POA: Diagnosis not present

## 2016-09-17 DIAGNOSIS — M21619 Bunion of unspecified foot: Secondary | ICD-10-CM | POA: Diagnosis not present

## 2016-09-27 NOTE — Progress Notes (Signed)
Subjective:     Patient ID: Dawn Bonilla, female   DOB: 18-Jul-1961, 55 y.o.   MRN: [id number]  HPI patient states she's doing well with her right foot with discomfort if she's on it for too long and time with mild swelling   Review of Systems     Objective:   Physical Exam Neurovascular status intact negative Homan sign was noted with wound edges well coapted with good alignment noted of the first MPJ and good range of motion with no crepitus within the joint surface    Assessment:     Doing well post osteotomy first right    Plan:     Reviewed the importance of elevation continue compression and gradual reduction of immobilization. I explained continued range of motion exercises and that swelling will persist and will be seen back 6 weeks or earlier if any issues should occur  X-ray report indicates osteotomy is healing well with fixation in place and good alignment

## 2016-10-12 DIAGNOSIS — M9902 Segmental and somatic dysfunction of thoracic region: Secondary | ICD-10-CM | POA: Diagnosis not present

## 2016-10-12 DIAGNOSIS — M9903 Segmental and somatic dysfunction of lumbar region: Secondary | ICD-10-CM | POA: Diagnosis not present

## 2016-10-12 DIAGNOSIS — M9905 Segmental and somatic dysfunction of pelvic region: Secondary | ICD-10-CM | POA: Diagnosis not present

## 2016-10-12 DIAGNOSIS — M9907 Segmental and somatic dysfunction of upper extremity: Secondary | ICD-10-CM | POA: Diagnosis not present

## 2016-10-29 ENCOUNTER — Ambulatory Visit (INDEPENDENT_AMBULATORY_CARE_PROVIDER_SITE_OTHER): Payer: BLUE CROSS/BLUE SHIELD | Admitting: Podiatry

## 2016-10-29 ENCOUNTER — Ambulatory Visit (INDEPENDENT_AMBULATORY_CARE_PROVIDER_SITE_OTHER): Payer: BLUE CROSS/BLUE SHIELD

## 2016-10-29 DIAGNOSIS — M205X1 Other deformities of toe(s) (acquired), right foot: Secondary | ICD-10-CM

## 2016-11-01 NOTE — Progress Notes (Signed)
Subjective:     Patient ID: Dawn Bonilla, female   DOB: Jul 24, 1961, 55 y.o.   MRN: [id number]  HPI patient is doing well with her right foot with discomfort still if she does a lot of walking but overall improving   Review of Systems     Objective:   Physical Exam Neurovascular status intact patient's found to have well coapted incision with good range of motion of approximate 30 dorsiflexion 20 plantar flexion with no pain no crepitus within the joint    Assessment:     Inflammatory hallux limitus deformity right which is doing well post several months surgery    Plan:     H&P x-rays reviewed and allow patient to return to more aggressive shoe gear at this time. Do not do any exercises were she's jumping on her foot and I did stress the importance of range of motion exercises  X-ray report indicated osteotomies healing well pins in place with no signs of movement

## 2016-11-09 DIAGNOSIS — M9907 Segmental and somatic dysfunction of upper extremity: Secondary | ICD-10-CM | POA: Diagnosis not present

## 2016-11-09 DIAGNOSIS — M9902 Segmental and somatic dysfunction of thoracic region: Secondary | ICD-10-CM | POA: Diagnosis not present

## 2016-11-09 DIAGNOSIS — M9903 Segmental and somatic dysfunction of lumbar region: Secondary | ICD-10-CM | POA: Diagnosis not present

## 2016-11-09 DIAGNOSIS — M9905 Segmental and somatic dysfunction of pelvic region: Secondary | ICD-10-CM | POA: Diagnosis not present

## 2016-12-07 DIAGNOSIS — M9902 Segmental and somatic dysfunction of thoracic region: Secondary | ICD-10-CM | POA: Diagnosis not present

## 2016-12-07 DIAGNOSIS — M9905 Segmental and somatic dysfunction of pelvic region: Secondary | ICD-10-CM | POA: Diagnosis not present

## 2016-12-07 DIAGNOSIS — M9907 Segmental and somatic dysfunction of upper extremity: Secondary | ICD-10-CM | POA: Diagnosis not present

## 2016-12-07 DIAGNOSIS — M9903 Segmental and somatic dysfunction of lumbar region: Secondary | ICD-10-CM | POA: Diagnosis not present

## 2016-12-15 DIAGNOSIS — M47817 Spondylosis without myelopathy or radiculopathy, lumbosacral region: Secondary | ICD-10-CM | POA: Diagnosis not present

## 2016-12-15 DIAGNOSIS — M545 Low back pain: Secondary | ICD-10-CM | POA: Diagnosis not present

## 2016-12-30 ENCOUNTER — Ambulatory Visit: Payer: BLUE CROSS/BLUE SHIELD | Admitting: Obstetrics and Gynecology

## 2017-01-05 DIAGNOSIS — M9902 Segmental and somatic dysfunction of thoracic region: Secondary | ICD-10-CM | POA: Diagnosis not present

## 2017-01-05 DIAGNOSIS — M9907 Segmental and somatic dysfunction of upper extremity: Secondary | ICD-10-CM | POA: Diagnosis not present

## 2017-01-05 DIAGNOSIS — M9905 Segmental and somatic dysfunction of pelvic region: Secondary | ICD-10-CM | POA: Diagnosis not present

## 2017-01-05 DIAGNOSIS — M9903 Segmental and somatic dysfunction of lumbar region: Secondary | ICD-10-CM | POA: Diagnosis not present

## 2017-01-07 DIAGNOSIS — M47817 Spondylosis without myelopathy or radiculopathy, lumbosacral region: Secondary | ICD-10-CM | POA: Diagnosis not present

## 2017-01-07 DIAGNOSIS — M545 Low back pain: Secondary | ICD-10-CM | POA: Diagnosis not present

## 2017-01-11 ENCOUNTER — Telehealth: Payer: Self-pay | Admitting: Obstetrics and Gynecology

## 2017-01-11 NOTE — Telephone Encounter (Signed)
Encounter closed

## 2017-01-11 NOTE — Telephone Encounter (Signed)
Patient canceled her upcoming aex appointment 01/15/17 and has decided to see her internist for her annual exams.

## 2017-01-15 ENCOUNTER — Ambulatory Visit: Payer: BLUE CROSS/BLUE SHIELD | Admitting: Obstetrics and Gynecology

## 2017-01-26 DIAGNOSIS — M545 Low back pain: Secondary | ICD-10-CM | POA: Diagnosis not present

## 2017-01-26 DIAGNOSIS — M47817 Spondylosis without myelopathy or radiculopathy, lumbosacral region: Secondary | ICD-10-CM | POA: Diagnosis not present

## 2017-01-26 DIAGNOSIS — M25551 Pain in right hip: Secondary | ICD-10-CM | POA: Diagnosis not present

## 2017-02-02 DIAGNOSIS — M9903 Segmental and somatic dysfunction of lumbar region: Secondary | ICD-10-CM | POA: Diagnosis not present

## 2017-02-02 DIAGNOSIS — M9907 Segmental and somatic dysfunction of upper extremity: Secondary | ICD-10-CM | POA: Diagnosis not present

## 2017-02-02 DIAGNOSIS — M9902 Segmental and somatic dysfunction of thoracic region: Secondary | ICD-10-CM | POA: Diagnosis not present

## 2017-02-02 DIAGNOSIS — M9905 Segmental and somatic dysfunction of pelvic region: Secondary | ICD-10-CM | POA: Diagnosis not present

## 2017-02-09 DIAGNOSIS — S61112A Laceration without foreign body of left thumb with damage to nail, initial encounter: Secondary | ICD-10-CM | POA: Diagnosis not present

## 2017-02-16 DIAGNOSIS — S61112D Laceration without foreign body of left thumb with damage to nail, subsequent encounter: Secondary | ICD-10-CM | POA: Diagnosis not present

## 2017-02-23 DIAGNOSIS — M545 Low back pain: Secondary | ICD-10-CM | POA: Diagnosis not present

## 2017-02-23 DIAGNOSIS — M47817 Spondylosis without myelopathy or radiculopathy, lumbosacral region: Secondary | ICD-10-CM | POA: Diagnosis not present

## 2017-03-01 DIAGNOSIS — M9905 Segmental and somatic dysfunction of pelvic region: Secondary | ICD-10-CM | POA: Diagnosis not present

## 2017-03-01 DIAGNOSIS — M9903 Segmental and somatic dysfunction of lumbar region: Secondary | ICD-10-CM | POA: Diagnosis not present

## 2017-03-01 DIAGNOSIS — M9907 Segmental and somatic dysfunction of upper extremity: Secondary | ICD-10-CM | POA: Diagnosis not present

## 2017-03-01 DIAGNOSIS — M9902 Segmental and somatic dysfunction of thoracic region: Secondary | ICD-10-CM | POA: Diagnosis not present

## 2017-03-30 DIAGNOSIS — M9902 Segmental and somatic dysfunction of thoracic region: Secondary | ICD-10-CM | POA: Diagnosis not present

## 2017-03-30 DIAGNOSIS — M9903 Segmental and somatic dysfunction of lumbar region: Secondary | ICD-10-CM | POA: Diagnosis not present

## 2017-03-30 DIAGNOSIS — M9907 Segmental and somatic dysfunction of upper extremity: Secondary | ICD-10-CM | POA: Diagnosis not present

## 2017-03-30 DIAGNOSIS — M9905 Segmental and somatic dysfunction of pelvic region: Secondary | ICD-10-CM | POA: Diagnosis not present

## 2017-04-27 DIAGNOSIS — M9903 Segmental and somatic dysfunction of lumbar region: Secondary | ICD-10-CM | POA: Diagnosis not present

## 2017-04-27 DIAGNOSIS — M9902 Segmental and somatic dysfunction of thoracic region: Secondary | ICD-10-CM | POA: Diagnosis not present

## 2017-04-27 DIAGNOSIS — M9905 Segmental and somatic dysfunction of pelvic region: Secondary | ICD-10-CM | POA: Diagnosis not present

## 2017-04-27 DIAGNOSIS — M9907 Segmental and somatic dysfunction of upper extremity: Secondary | ICD-10-CM | POA: Diagnosis not present

## 2017-05-14 DIAGNOSIS — N3001 Acute cystitis with hematuria: Secondary | ICD-10-CM | POA: Diagnosis not present

## 2017-05-14 DIAGNOSIS — R300 Dysuria: Secondary | ICD-10-CM | POA: Diagnosis not present

## 2017-06-01 DIAGNOSIS — M9903 Segmental and somatic dysfunction of lumbar region: Secondary | ICD-10-CM | POA: Diagnosis not present

## 2017-06-01 DIAGNOSIS — M9907 Segmental and somatic dysfunction of upper extremity: Secondary | ICD-10-CM | POA: Diagnosis not present

## 2017-06-01 DIAGNOSIS — M9902 Segmental and somatic dysfunction of thoracic region: Secondary | ICD-10-CM | POA: Diagnosis not present

## 2017-06-01 DIAGNOSIS — M9905 Segmental and somatic dysfunction of pelvic region: Secondary | ICD-10-CM | POA: Diagnosis not present

## 2017-06-16 IMAGING — MR MR HEAD WO/W CM
10 of 11 series · 41 of 48 positions shown · IV contrast (multihance)
Comparison: None.

CLINICAL DATA: Bilateral sensorineural hearing loss. Right-sided
tinnitus/hummingnoise for 2 months.

EXAM:
MRI HEAD WITHOUT AND WITH CONTRAST
TECHNIQUE: Multiplanar, multiecho pulse sequences of the brain and surrounding
structures were obtained without and with intravenous contrast.
CONTRAST:  13mL MULTIHANCE GADOBENATE DIMEGLUMINE 529 MG/ML IV SOLN

[Series 5: T1 · sagittal · 4.0mm · 0.72mm/px · 3 of 26 slices shown (1 of 3)]
[im 1/26]
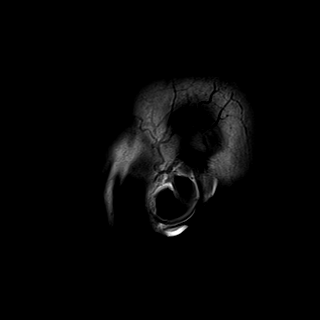
[im 13/26]
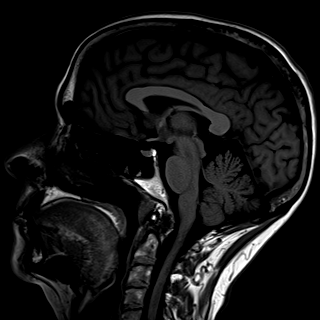
[im 26/26]
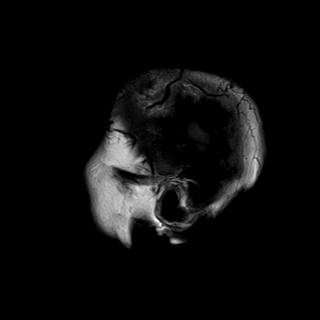

[Series 6: DWI · axial · 3.0mm · 1.44mm/px · z∈[-67,+85]mm · 8 of 80 slices shown (1 of 2)]
[im 1/80]
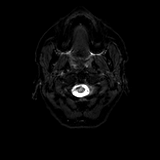
[im 12/80]
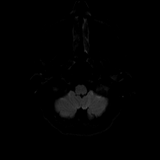
[im 23/80]
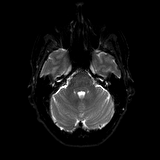
[im 34/80]
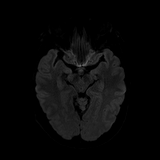
[im 46/80]
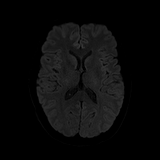
[im 57/80]
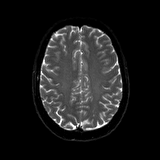
[im 68/80]
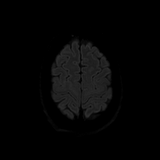
[im 80/80]
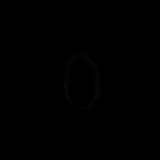

[Series 7: DWI · axial · 3.0mm · 1.44mm/px · z∈[-67,+85]mm · 4 of 39 slices shown (2 of 2)]
[im 1/39]
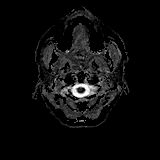
[im 13/39]
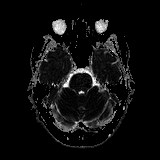
[im 26/39]
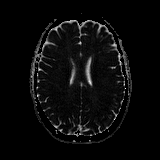
[im 39/39]
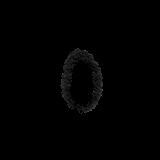

[Series 8: T2 · axial · 4.0mm · 0.36mm/px · z∈[-64,+75]mm · 3 of 28 slices shown]
[im 1/28]
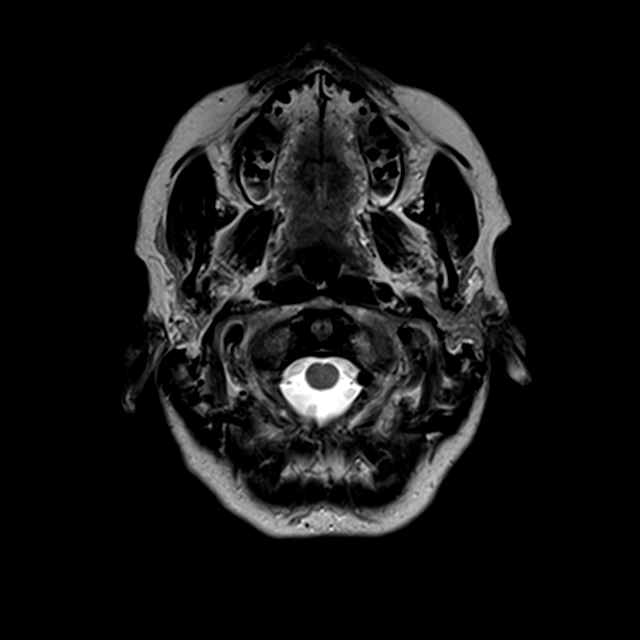
[im 14/28]
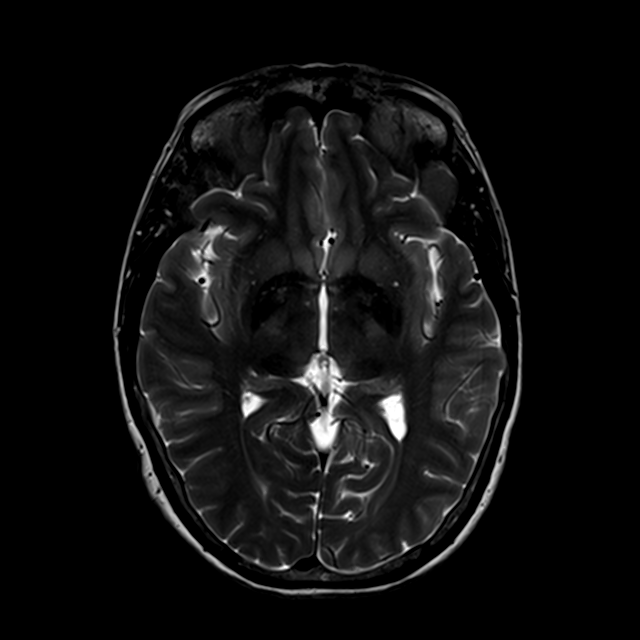
[im 28/28]
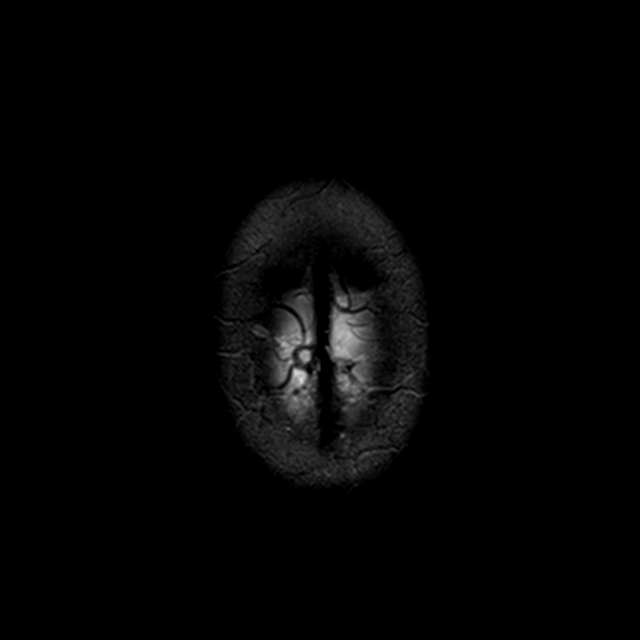

[Series 9: FLAIR · axial · 4.0mm · 0.72mm/px · z∈[-64,+75]mm · 3 of 28 slices shown]
[im 1/28]
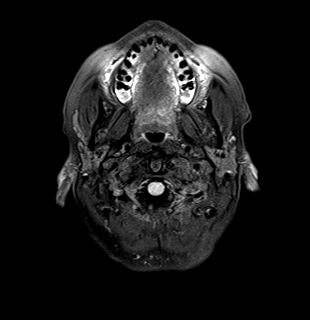
[im 14/28]
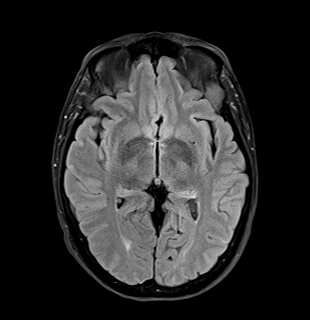
[im 28/28]
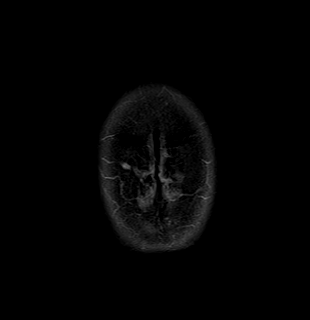

[Series 10: T1 · coronal · 2.5mm · 0.56mm/px · 1 of 11 slices shown (2 of 3)]
[im 1/11]
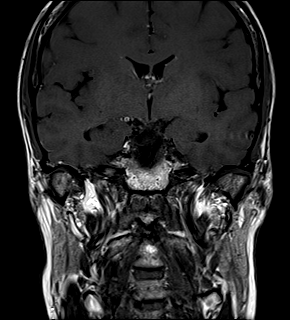

[Series 11: T1 · axial · 2.5mm · 0.59mm/px · 1 of 11 slices shown (3 of 3)]
[im 1/11]
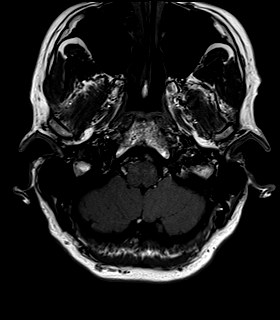

[Series 13: T1 post-contrast · coronal · 2.5mm · 0.56mm/px · 1 of 11 slices shown (1 of 3)]
[im 1/11]
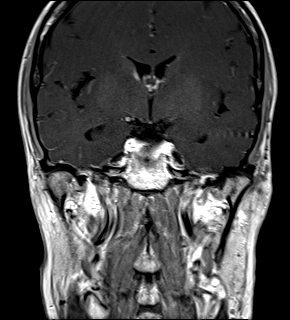

[Series 14: T1 post-contrast · axial · 2.5mm · 0.59mm/px · 1 of 11 slices shown (2 of 3)]
[im 1/11]
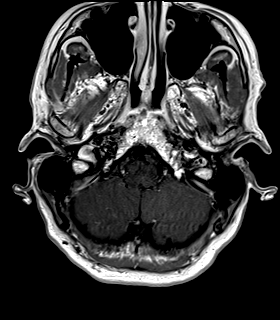

[Series 15: T1 post-contrast · axial · 1.0mm · 0.90mm/px · z∈[-74,+84]mm · 16 of 160 slices shown (3 of 3)]
[im 1/160]
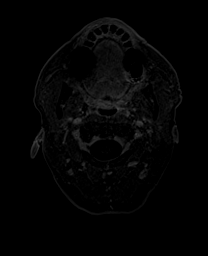
[im 11/160]
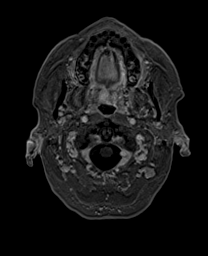
[im 22/160]
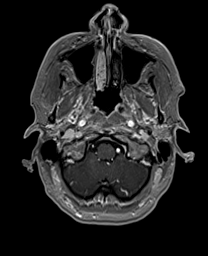
[im 32/160]
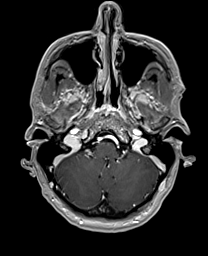
[im 43/160]
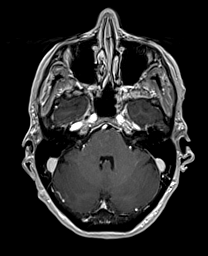
[im 54/160]
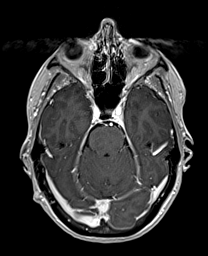
[im 64/160]
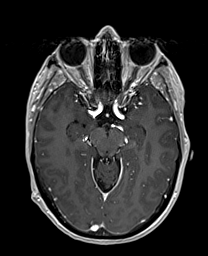
[im 75/160]
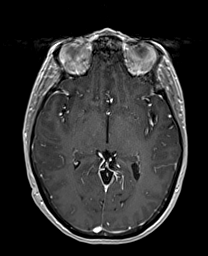
[im 85/160]
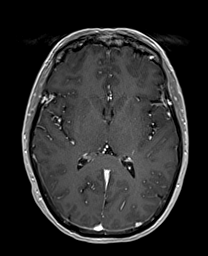
[im 96/160]
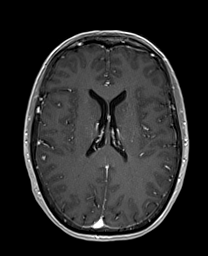
[im 107/160]
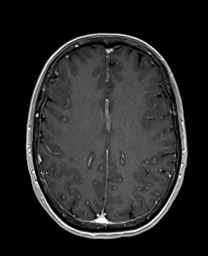
[im 117/160]
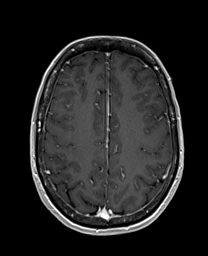
[im 128/160]
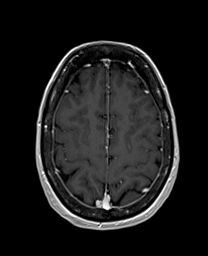
[im 138/160]
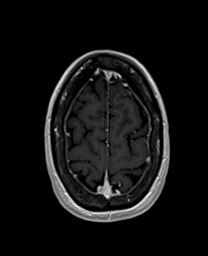
[im 149/160]
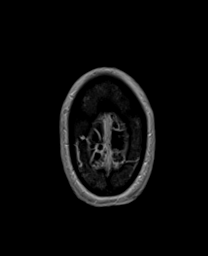
[im 160/160]
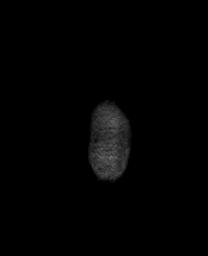

[41 of 48 positions shown; findings below may reference images not displayed]

FINDINGS: There is no evidence of acute infarct, intracranial hemorrhage,
mass, midline shift, or extra-axial fluid collection. The ventricles
and sulci are normal. The brain is normal in signal. No abnormal
enhancement is identified.

Orbits are unremarkable. Paranasal sinuses and mastoid air cells are
clear. Major intracranial vascular flow voids are preserved.
Arachnoid granulations are incidentally noted in the occipital bone.

Dedicated imaging through the internal auditory canals demonstrates
a normal course of cranial nerves VII and VIII without evidence of
mass or abnormal enhancement. Inner ear structures demonstrate
normal signal bilaterally. No mass is seen within the
cerebellopontine angles.
IMPRESSION: Unremarkable appearance of the brain and internal auditory canals.
No evidence of vestibular schwannoma.

## 2017-07-06 DIAGNOSIS — M9907 Segmental and somatic dysfunction of upper extremity: Secondary | ICD-10-CM | POA: Diagnosis not present

## 2017-07-06 DIAGNOSIS — M9903 Segmental and somatic dysfunction of lumbar region: Secondary | ICD-10-CM | POA: Diagnosis not present

## 2017-07-06 DIAGNOSIS — M9902 Segmental and somatic dysfunction of thoracic region: Secondary | ICD-10-CM | POA: Diagnosis not present

## 2017-07-06 DIAGNOSIS — M9905 Segmental and somatic dysfunction of pelvic region: Secondary | ICD-10-CM | POA: Diagnosis not present

## 2017-07-27 DIAGNOSIS — Z803 Family history of malignant neoplasm of breast: Secondary | ICD-10-CM | POA: Diagnosis not present

## 2017-07-27 DIAGNOSIS — Z1231 Encounter for screening mammogram for malignant neoplasm of breast: Secondary | ICD-10-CM | POA: Diagnosis not present

## 2017-08-10 DIAGNOSIS — M9907 Segmental and somatic dysfunction of upper extremity: Secondary | ICD-10-CM | POA: Diagnosis not present

## 2017-08-10 DIAGNOSIS — M9905 Segmental and somatic dysfunction of pelvic region: Secondary | ICD-10-CM | POA: Diagnosis not present

## 2017-08-10 DIAGNOSIS — M9902 Segmental and somatic dysfunction of thoracic region: Secondary | ICD-10-CM | POA: Diagnosis not present

## 2017-08-10 DIAGNOSIS — M9903 Segmental and somatic dysfunction of lumbar region: Secondary | ICD-10-CM | POA: Diagnosis not present

## 2017-08-13 DIAGNOSIS — E559 Vitamin D deficiency, unspecified: Secondary | ICD-10-CM | POA: Diagnosis not present

## 2017-08-13 DIAGNOSIS — E785 Hyperlipidemia, unspecified: Secondary | ICD-10-CM | POA: Diagnosis not present

## 2017-08-13 DIAGNOSIS — Z23 Encounter for immunization: Secondary | ICD-10-CM | POA: Diagnosis not present

## 2017-08-13 DIAGNOSIS — Z0000 Encounter for general adult medical examination without abnormal findings: Secondary | ICD-10-CM | POA: Diagnosis not present

## 2017-08-13 DIAGNOSIS — Z79899 Other long term (current) drug therapy: Secondary | ICD-10-CM | POA: Diagnosis not present

## 2017-09-15 DIAGNOSIS — M9905 Segmental and somatic dysfunction of pelvic region: Secondary | ICD-10-CM | POA: Diagnosis not present

## 2017-09-15 DIAGNOSIS — M9907 Segmental and somatic dysfunction of upper extremity: Secondary | ICD-10-CM | POA: Diagnosis not present

## 2017-09-15 DIAGNOSIS — M9903 Segmental and somatic dysfunction of lumbar region: Secondary | ICD-10-CM | POA: Diagnosis not present

## 2017-09-15 DIAGNOSIS — M9902 Segmental and somatic dysfunction of thoracic region: Secondary | ICD-10-CM | POA: Diagnosis not present

## 2017-09-27 DIAGNOSIS — D225 Melanocytic nevi of trunk: Secondary | ICD-10-CM | POA: Diagnosis not present

## 2017-09-27 DIAGNOSIS — D1801 Hemangioma of skin and subcutaneous tissue: Secondary | ICD-10-CM | POA: Diagnosis not present

## 2017-09-27 DIAGNOSIS — L814 Other melanin hyperpigmentation: Secondary | ICD-10-CM | POA: Diagnosis not present

## 2017-09-27 DIAGNOSIS — L820 Inflamed seborrheic keratosis: Secondary | ICD-10-CM | POA: Diagnosis not present

## 2017-10-22 DIAGNOSIS — M9902 Segmental and somatic dysfunction of thoracic region: Secondary | ICD-10-CM | POA: Diagnosis not present

## 2017-10-22 DIAGNOSIS — M9905 Segmental and somatic dysfunction of pelvic region: Secondary | ICD-10-CM | POA: Diagnosis not present

## 2017-10-22 DIAGNOSIS — M9903 Segmental and somatic dysfunction of lumbar region: Secondary | ICD-10-CM | POA: Diagnosis not present

## 2017-10-22 DIAGNOSIS — M9907 Segmental and somatic dysfunction of upper extremity: Secondary | ICD-10-CM | POA: Diagnosis not present

## 2017-11-30 DIAGNOSIS — M9905 Segmental and somatic dysfunction of pelvic region: Secondary | ICD-10-CM | POA: Diagnosis not present

## 2017-11-30 DIAGNOSIS — M9907 Segmental and somatic dysfunction of upper extremity: Secondary | ICD-10-CM | POA: Diagnosis not present

## 2017-11-30 DIAGNOSIS — M9903 Segmental and somatic dysfunction of lumbar region: Secondary | ICD-10-CM | POA: Diagnosis not present

## 2017-11-30 DIAGNOSIS — M9902 Segmental and somatic dysfunction of thoracic region: Secondary | ICD-10-CM | POA: Diagnosis not present

## 2017-12-09 ENCOUNTER — Ambulatory Visit (INDEPENDENT_AMBULATORY_CARE_PROVIDER_SITE_OTHER): Payer: BLUE CROSS/BLUE SHIELD | Admitting: Podiatry

## 2017-12-09 ENCOUNTER — Other Ambulatory Visit: Payer: Self-pay | Admitting: Podiatry

## 2017-12-09 ENCOUNTER — Encounter: Payer: Self-pay | Admitting: Podiatry

## 2017-12-09 ENCOUNTER — Ambulatory Visit (INDEPENDENT_AMBULATORY_CARE_PROVIDER_SITE_OTHER): Payer: BLUE CROSS/BLUE SHIELD

## 2017-12-09 DIAGNOSIS — L84 Corns and callosities: Secondary | ICD-10-CM | POA: Diagnosis not present

## 2017-12-09 DIAGNOSIS — M216X9 Other acquired deformities of unspecified foot: Secondary | ICD-10-CM

## 2017-12-09 DIAGNOSIS — M79672 Pain in left foot: Secondary | ICD-10-CM

## 2017-12-09 DIAGNOSIS — M7752 Other enthesopathy of left foot: Secondary | ICD-10-CM | POA: Diagnosis not present

## 2017-12-09 DIAGNOSIS — M779 Enthesopathy, unspecified: Secondary | ICD-10-CM | POA: Diagnosis not present

## 2017-12-09 MED ORDER — TRIAMCINOLONE ACETONIDE 10 MG/ML IJ SUSP
10.0000 mg | Freq: Once | INTRAMUSCULAR | Status: AC
  Administered 2017-12-09: 10 mg

## 2017-12-09 NOTE — Progress Notes (Signed)
Subjective:   Patient ID: Susy FrizzleJulie A Piccione, female   DOB: 57 y.o.   MRN: [id number]   HPI Patient presents with lesion underneath the first metatarsal left that is been very sore with fluid buildup and making it hard for her to walk.  Also has a lesion on the outside of the foot but not as bad and has had previous surgery for hallux limitus right   ROS      Objective:  Physical Exam  Neurovascular status intact with keratotic lesion with fluid buildup around the first MPJ left with lucent core and also fifth metatarsal left and well-healing surgical site right with good range of motion     Assessment:  Plantarflexed first metatarsal left with inflammatory plantar capsulitis keratotic lesion formation and probably compensatory lateral lesion     Plan:  H&P all conditions reviewed and today I reviewed x-ray.  I then went ahead and I injected the sub-capsule left 3 mg Dexasone Kenalog 5 mg Xylocaine and using sterile his mentation debrided lesions with no iatrogenic bleeding and applied dancers pad.  Patient will be seen back on an as-needed basis and may require orthotics of more aggressive treatment if symptoms should persist  X-rays indicate there is enlargement of the tibial sesamoid left

## 2018-01-04 DIAGNOSIS — M9903 Segmental and somatic dysfunction of lumbar region: Secondary | ICD-10-CM | POA: Diagnosis not present

## 2018-01-04 DIAGNOSIS — M9907 Segmental and somatic dysfunction of upper extremity: Secondary | ICD-10-CM | POA: Diagnosis not present

## 2018-01-04 DIAGNOSIS — M9902 Segmental and somatic dysfunction of thoracic region: Secondary | ICD-10-CM | POA: Diagnosis not present

## 2018-01-04 DIAGNOSIS — M9905 Segmental and somatic dysfunction of pelvic region: Secondary | ICD-10-CM | POA: Diagnosis not present

## 2018-01-18 DIAGNOSIS — M461 Sacroiliitis, not elsewhere classified: Secondary | ICD-10-CM | POA: Diagnosis not present

## 2018-01-18 DIAGNOSIS — M47817 Spondylosis without myelopathy or radiculopathy, lumbosacral region: Secondary | ICD-10-CM | POA: Diagnosis not present

## 2018-01-18 DIAGNOSIS — M545 Low back pain: Secondary | ICD-10-CM | POA: Diagnosis not present

## 2018-02-01 DIAGNOSIS — M9907 Segmental and somatic dysfunction of upper extremity: Secondary | ICD-10-CM | POA: Diagnosis not present

## 2018-02-01 DIAGNOSIS — M9903 Segmental and somatic dysfunction of lumbar region: Secondary | ICD-10-CM | POA: Diagnosis not present

## 2018-02-01 DIAGNOSIS — M9902 Segmental and somatic dysfunction of thoracic region: Secondary | ICD-10-CM | POA: Diagnosis not present

## 2018-02-01 DIAGNOSIS — M9905 Segmental and somatic dysfunction of pelvic region: Secondary | ICD-10-CM | POA: Diagnosis not present

## 2018-02-10 DIAGNOSIS — M461 Sacroiliitis, not elsewhere classified: Secondary | ICD-10-CM | POA: Diagnosis not present

## 2018-02-10 DIAGNOSIS — M545 Low back pain: Secondary | ICD-10-CM | POA: Diagnosis not present

## 2018-03-08 DIAGNOSIS — M9907 Segmental and somatic dysfunction of upper extremity: Secondary | ICD-10-CM | POA: Diagnosis not present

## 2018-03-08 DIAGNOSIS — M9905 Segmental and somatic dysfunction of pelvic region: Secondary | ICD-10-CM | POA: Diagnosis not present

## 2018-03-08 DIAGNOSIS — M9902 Segmental and somatic dysfunction of thoracic region: Secondary | ICD-10-CM | POA: Diagnosis not present

## 2018-03-08 DIAGNOSIS — M9903 Segmental and somatic dysfunction of lumbar region: Secondary | ICD-10-CM | POA: Diagnosis not present

## 2018-03-10 DIAGNOSIS — M461 Sacroiliitis, not elsewhere classified: Secondary | ICD-10-CM | POA: Diagnosis not present

## 2018-03-10 DIAGNOSIS — M545 Low back pain: Secondary | ICD-10-CM | POA: Diagnosis not present

## 2018-04-04 DIAGNOSIS — M9903 Segmental and somatic dysfunction of lumbar region: Secondary | ICD-10-CM | POA: Diagnosis not present

## 2018-04-04 DIAGNOSIS — M9902 Segmental and somatic dysfunction of thoracic region: Secondary | ICD-10-CM | POA: Diagnosis not present

## 2018-04-04 DIAGNOSIS — M9905 Segmental and somatic dysfunction of pelvic region: Secondary | ICD-10-CM | POA: Diagnosis not present

## 2018-04-04 DIAGNOSIS — M9907 Segmental and somatic dysfunction of upper extremity: Secondary | ICD-10-CM | POA: Diagnosis not present

## 2018-05-09 DIAGNOSIS — M9903 Segmental and somatic dysfunction of lumbar region: Secondary | ICD-10-CM | POA: Diagnosis not present

## 2018-05-09 DIAGNOSIS — M9905 Segmental and somatic dysfunction of pelvic region: Secondary | ICD-10-CM | POA: Diagnosis not present

## 2018-05-09 DIAGNOSIS — M9907 Segmental and somatic dysfunction of upper extremity: Secondary | ICD-10-CM | POA: Diagnosis not present

## 2018-05-09 DIAGNOSIS — M9902 Segmental and somatic dysfunction of thoracic region: Secondary | ICD-10-CM | POA: Diagnosis not present

## 2018-06-15 DIAGNOSIS — M9902 Segmental and somatic dysfunction of thoracic region: Secondary | ICD-10-CM | POA: Diagnosis not present

## 2018-06-15 DIAGNOSIS — M9905 Segmental and somatic dysfunction of pelvic region: Secondary | ICD-10-CM | POA: Diagnosis not present

## 2018-06-15 DIAGNOSIS — M9903 Segmental and somatic dysfunction of lumbar region: Secondary | ICD-10-CM | POA: Diagnosis not present

## 2018-06-15 DIAGNOSIS — M9907 Segmental and somatic dysfunction of upper extremity: Secondary | ICD-10-CM | POA: Diagnosis not present

## 2018-08-01 DIAGNOSIS — Z803 Family history of malignant neoplasm of breast: Secondary | ICD-10-CM | POA: Diagnosis not present

## 2018-08-01 DIAGNOSIS — Z1231 Encounter for screening mammogram for malignant neoplasm of breast: Secondary | ICD-10-CM | POA: Diagnosis not present

## 2018-08-03 DIAGNOSIS — M9907 Segmental and somatic dysfunction of upper extremity: Secondary | ICD-10-CM | POA: Diagnosis not present

## 2018-08-03 DIAGNOSIS — M9903 Segmental and somatic dysfunction of lumbar region: Secondary | ICD-10-CM | POA: Diagnosis not present

## 2018-08-03 DIAGNOSIS — M9902 Segmental and somatic dysfunction of thoracic region: Secondary | ICD-10-CM | POA: Diagnosis not present

## 2018-08-03 DIAGNOSIS — M9905 Segmental and somatic dysfunction of pelvic region: Secondary | ICD-10-CM | POA: Diagnosis not present

## 2018-09-08 DIAGNOSIS — M9903 Segmental and somatic dysfunction of lumbar region: Secondary | ICD-10-CM | POA: Diagnosis not present

## 2018-09-08 DIAGNOSIS — M9907 Segmental and somatic dysfunction of upper extremity: Secondary | ICD-10-CM | POA: Diagnosis not present

## 2018-09-08 DIAGNOSIS — Z79899 Other long term (current) drug therapy: Secondary | ICD-10-CM | POA: Diagnosis not present

## 2018-09-08 DIAGNOSIS — M9905 Segmental and somatic dysfunction of pelvic region: Secondary | ICD-10-CM | POA: Diagnosis not present

## 2018-09-08 DIAGNOSIS — Z23 Encounter for immunization: Secondary | ICD-10-CM | POA: Diagnosis not present

## 2018-09-08 DIAGNOSIS — E559 Vitamin D deficiency, unspecified: Secondary | ICD-10-CM | POA: Diagnosis not present

## 2018-09-08 DIAGNOSIS — E785 Hyperlipidemia, unspecified: Secondary | ICD-10-CM | POA: Diagnosis not present

## 2018-09-08 DIAGNOSIS — M9902 Segmental and somatic dysfunction of thoracic region: Secondary | ICD-10-CM | POA: Diagnosis not present

## 2018-09-08 DIAGNOSIS — Z0000 Encounter for general adult medical examination without abnormal findings: Secondary | ICD-10-CM | POA: Diagnosis not present

## 2018-10-17 DIAGNOSIS — M9902 Segmental and somatic dysfunction of thoracic region: Secondary | ICD-10-CM | POA: Diagnosis not present

## 2018-10-17 DIAGNOSIS — M9907 Segmental and somatic dysfunction of upper extremity: Secondary | ICD-10-CM | POA: Diagnosis not present

## 2018-10-17 DIAGNOSIS — M9903 Segmental and somatic dysfunction of lumbar region: Secondary | ICD-10-CM | POA: Diagnosis not present

## 2018-10-17 DIAGNOSIS — M9905 Segmental and somatic dysfunction of pelvic region: Secondary | ICD-10-CM | POA: Diagnosis not present

## 2018-11-09 DIAGNOSIS — Z23 Encounter for immunization: Secondary | ICD-10-CM | POA: Diagnosis not present

## 2018-11-21 DIAGNOSIS — M9903 Segmental and somatic dysfunction of lumbar region: Secondary | ICD-10-CM | POA: Diagnosis not present

## 2018-11-21 DIAGNOSIS — M9905 Segmental and somatic dysfunction of pelvic region: Secondary | ICD-10-CM | POA: Diagnosis not present

## 2018-11-21 DIAGNOSIS — M9902 Segmental and somatic dysfunction of thoracic region: Secondary | ICD-10-CM | POA: Diagnosis not present

## 2018-11-21 DIAGNOSIS — M9907 Segmental and somatic dysfunction of upper extremity: Secondary | ICD-10-CM | POA: Diagnosis not present

## 2018-12-19 DIAGNOSIS — M9905 Segmental and somatic dysfunction of pelvic region: Secondary | ICD-10-CM | POA: Diagnosis not present

## 2018-12-19 DIAGNOSIS — M9907 Segmental and somatic dysfunction of upper extremity: Secondary | ICD-10-CM | POA: Diagnosis not present

## 2018-12-19 DIAGNOSIS — M9903 Segmental and somatic dysfunction of lumbar region: Secondary | ICD-10-CM | POA: Diagnosis not present

## 2018-12-19 DIAGNOSIS — M9902 Segmental and somatic dysfunction of thoracic region: Secondary | ICD-10-CM | POA: Diagnosis not present

## 2019-04-18 DIAGNOSIS — R509 Fever, unspecified: Secondary | ICD-10-CM | POA: Diagnosis not present

## 2019-04-18 DIAGNOSIS — J029 Acute pharyngitis, unspecified: Secondary | ICD-10-CM | POA: Diagnosis not present

## 2019-04-19 DIAGNOSIS — R509 Fever, unspecified: Secondary | ICD-10-CM | POA: Diagnosis not present

## 2019-04-19 DIAGNOSIS — J029 Acute pharyngitis, unspecified: Secondary | ICD-10-CM | POA: Diagnosis not present

## 2019-06-02 DIAGNOSIS — N390 Urinary tract infection, site not specified: Secondary | ICD-10-CM | POA: Diagnosis not present

## 2019-08-07 DIAGNOSIS — Z1231 Encounter for screening mammogram for malignant neoplasm of breast: Secondary | ICD-10-CM | POA: Diagnosis not present

## 2019-08-07 DIAGNOSIS — Z803 Family history of malignant neoplasm of breast: Secondary | ICD-10-CM | POA: Diagnosis not present

## 2019-10-03 DIAGNOSIS — D225 Melanocytic nevi of trunk: Secondary | ICD-10-CM | POA: Diagnosis not present

## 2019-10-03 DIAGNOSIS — Z808 Family history of malignant neoplasm of other organs or systems: Secondary | ICD-10-CM | POA: Diagnosis not present

## 2019-10-03 DIAGNOSIS — L821 Other seborrheic keratosis: Secondary | ICD-10-CM | POA: Diagnosis not present

## 2019-10-03 DIAGNOSIS — L814 Other melanin hyperpigmentation: Secondary | ICD-10-CM | POA: Diagnosis not present

## 2019-11-14 DIAGNOSIS — Z0000 Encounter for general adult medical examination without abnormal findings: Secondary | ICD-10-CM | POA: Diagnosis not present

## 2019-11-22 DIAGNOSIS — Z79899 Other long term (current) drug therapy: Secondary | ICD-10-CM | POA: Diagnosis not present

## 2019-11-22 DIAGNOSIS — E559 Vitamin D deficiency, unspecified: Secondary | ICD-10-CM | POA: Diagnosis not present

## 2019-11-22 DIAGNOSIS — E785 Hyperlipidemia, unspecified: Secondary | ICD-10-CM | POA: Diagnosis not present

## 2020-05-30 DIAGNOSIS — R350 Frequency of micturition: Secondary | ICD-10-CM | POA: Diagnosis not present

## 2020-07-04 ENCOUNTER — Other Ambulatory Visit: Payer: Self-pay

## 2020-07-04 DIAGNOSIS — Z20822 Contact with and (suspected) exposure to covid-19: Secondary | ICD-10-CM

## 2020-07-05 LAB — NOVEL CORONAVIRUS, NAA: SARS-CoV-2, NAA: NOT DETECTED

## 2020-07-05 LAB — SARS-COV-2, NAA 2 DAY TAT

## 2020-08-19 DIAGNOSIS — Z1231 Encounter for screening mammogram for malignant neoplasm of breast: Secondary | ICD-10-CM | POA: Diagnosis not present

## 2021-07-16 DIAGNOSIS — J3489 Other specified disorders of nose and nasal sinuses: Secondary | ICD-10-CM | POA: Diagnosis not present

## 2021-07-16 DIAGNOSIS — R059 Cough, unspecified: Secondary | ICD-10-CM | POA: Diagnosis not present

## 2021-07-16 DIAGNOSIS — H60312 Diffuse otitis externa, left ear: Secondary | ICD-10-CM | POA: Diagnosis not present

## 2021-07-16 DIAGNOSIS — H608X3 Other otitis externa, bilateral: Secondary | ICD-10-CM | POA: Diagnosis not present

## 2021-08-06 DIAGNOSIS — M950 Acquired deformity of nose: Secondary | ICD-10-CM | POA: Diagnosis not present

## 2021-08-06 DIAGNOSIS — H6122 Impacted cerumen, left ear: Secondary | ICD-10-CM | POA: Diagnosis not present

## 2021-08-06 DIAGNOSIS — H60502 Unspecified acute noninfective otitis externa, left ear: Secondary | ICD-10-CM | POA: Diagnosis not present

## 2021-08-06 DIAGNOSIS — R059 Cough, unspecified: Secondary | ICD-10-CM | POA: Diagnosis not present

## 2021-08-06 DIAGNOSIS — H608X3 Other otitis externa, bilateral: Secondary | ICD-10-CM | POA: Diagnosis not present

## 2021-08-21 DIAGNOSIS — Z1231 Encounter for screening mammogram for malignant neoplasm of breast: Secondary | ICD-10-CM | POA: Diagnosis not present

## 2021-09-03 ENCOUNTER — Other Ambulatory Visit: Payer: Self-pay | Admitting: Family Medicine

## 2021-09-03 ENCOUNTER — Other Ambulatory Visit (HOSPITAL_COMMUNITY)
Admission: RE | Admit: 2021-09-03 | Discharge: 2021-09-03 | Disposition: A | Discharge status: Home / Self Care | Payer: BC Managed Care – PPO | Source: Ambulatory Visit | Attending: Family Medicine | Admitting: Family Medicine

## 2021-09-03 DIAGNOSIS — Z0000 Encounter for general adult medical examination without abnormal findings: Secondary | ICD-10-CM | POA: Diagnosis not present

## 2021-09-03 DIAGNOSIS — Z01411 Encounter for gynecological examination (general) (routine) with abnormal findings: Secondary | ICD-10-CM | POA: Diagnosis not present

## 2021-09-03 DIAGNOSIS — E559 Vitamin D deficiency, unspecified: Secondary | ICD-10-CM | POA: Diagnosis not present

## 2021-09-03 DIAGNOSIS — E785 Hyperlipidemia, unspecified: Secondary | ICD-10-CM | POA: Diagnosis not present

## 2021-09-05 LAB — CYTOLOGY - PAP
Comment: NEGATIVE
Diagnosis: NEGATIVE
High risk HPV: NEGATIVE

## 2021-10-02 DIAGNOSIS — D225 Melanocytic nevi of trunk: Secondary | ICD-10-CM | POA: Diagnosis not present

## 2021-10-02 DIAGNOSIS — L820 Inflamed seborrheic keratosis: Secondary | ICD-10-CM | POA: Diagnosis not present

## 2021-10-02 DIAGNOSIS — L821 Other seborrheic keratosis: Secondary | ICD-10-CM | POA: Diagnosis not present

## 2021-10-02 DIAGNOSIS — L814 Other melanin hyperpigmentation: Secondary | ICD-10-CM | POA: Diagnosis not present

## 2021-10-02 DIAGNOSIS — Z808 Family history of malignant neoplasm of other organs or systems: Secondary | ICD-10-CM | POA: Diagnosis not present

## 2021-11-10 DIAGNOSIS — R051 Acute cough: Secondary | ICD-10-CM | POA: Diagnosis not present

## 2021-11-10 DIAGNOSIS — R0982 Postnasal drip: Secondary | ICD-10-CM | POA: Diagnosis not present

## 2021-11-10 DIAGNOSIS — Z20822 Contact with and (suspected) exposure to covid-19: Secondary | ICD-10-CM | POA: Diagnosis not present

## 2022-01-20 DIAGNOSIS — Z86010 Personal history of colonic polyps: Secondary | ICD-10-CM | POA: Diagnosis not present

## 2022-02-05 DIAGNOSIS — M5416 Radiculopathy, lumbar region: Secondary | ICD-10-CM | POA: Diagnosis not present

## 2022-02-05 DIAGNOSIS — M47817 Spondylosis without myelopathy or radiculopathy, lumbosacral region: Secondary | ICD-10-CM | POA: Diagnosis not present

## 2022-02-09 DIAGNOSIS — M545 Low back pain, unspecified: Secondary | ICD-10-CM | POA: Diagnosis not present

## 2022-02-17 DIAGNOSIS — M5416 Radiculopathy, lumbar region: Secondary | ICD-10-CM | POA: Diagnosis not present

## 2022-02-17 DIAGNOSIS — M47817 Spondylosis without myelopathy or radiculopathy, lumbosacral region: Secondary | ICD-10-CM | POA: Diagnosis not present

## 2022-02-26 DIAGNOSIS — H6063 Unspecified chronic otitis externa, bilateral: Secondary | ICD-10-CM | POA: Diagnosis not present

## 2022-05-04 ENCOUNTER — Ambulatory Visit (INDEPENDENT_AMBULATORY_CARE_PROVIDER_SITE_OTHER): Payer: BC Managed Care – PPO

## 2022-05-04 ENCOUNTER — Ambulatory Visit (INDEPENDENT_AMBULATORY_CARE_PROVIDER_SITE_OTHER): Payer: BC Managed Care – PPO | Admitting: Podiatry

## 2022-05-04 ENCOUNTER — Encounter: Payer: Self-pay | Admitting: Podiatry

## 2022-05-04 DIAGNOSIS — M779 Enthesopathy, unspecified: Secondary | ICD-10-CM

## 2022-05-04 DIAGNOSIS — M79671 Pain in right foot: Secondary | ICD-10-CM

## 2022-05-04 DIAGNOSIS — Z860100 Personal history of colon polyps, unspecified: Secondary | ICD-10-CM | POA: Insufficient documentation

## 2022-05-04 DIAGNOSIS — F329 Major depressive disorder, single episode, unspecified: Secondary | ICD-10-CM | POA: Insufficient documentation

## 2022-05-04 DIAGNOSIS — M7751 Other enthesopathy of right foot: Secondary | ICD-10-CM | POA: Diagnosis not present

## 2022-05-04 DIAGNOSIS — E559 Vitamin D deficiency, unspecified: Secondary | ICD-10-CM | POA: Insufficient documentation

## 2022-05-04 DIAGNOSIS — E785 Hyperlipidemia, unspecified: Secondary | ICD-10-CM | POA: Insufficient documentation

## 2022-05-04 DIAGNOSIS — M2550 Pain in unspecified joint: Secondary | ICD-10-CM | POA: Insufficient documentation

## 2022-05-04 DIAGNOSIS — H9190 Unspecified hearing loss, unspecified ear: Secondary | ICD-10-CM | POA: Insufficient documentation

## 2022-05-04 DIAGNOSIS — Z803 Family history of malignant neoplasm of breast: Secondary | ICD-10-CM | POA: Insufficient documentation

## 2022-05-04 MED ORDER — TRIAMCINOLONE ACETONIDE 10 MG/ML IJ SUSP
10.0000 mg | Freq: Once | INTRAMUSCULAR | Status: AC
  Administered 2022-05-04: 10 mg

## 2022-05-04 NOTE — Progress Notes (Signed)
Subjective:   Patient ID: Dawn Bonilla, female   DOB: 61 y.o.   MRN: [id number]   HPI Patient states over the last 6 months that she started develop pain in her forefoot right and it is gotten worse gradually over that time and especially last few months.  States at times it can be hard to walk on and patient does not currently smoke and wants to be more active   Review of Systems  All other systems reviewed and are negative.       Objective:  Physical Exam Vitals and nursing note reviewed.  Constitutional:      Appearance: She is well-developed.  Pulmonary:     Effort: Pulmonary effort is normal.  Musculoskeletal:        General: Normal range of motion.  Skin:    General: Skin is warm.  Neurological:     Mental Status: She is alert.     Neurovascular status found to be intact muscle strength was found to be adequate range of motion adequate.  Patient is noted to have inflammation around the second metatarsal phalangeal joint with fluid buildup around the joint surface that is painful when pressed and does have a healed bunion right with a small spur on the medial side nonpainful     Assessment:  Acute capsulitis of the right second MPJ moderate in intensity with slight spurring of the right first metatarsal nonsymptomatic     Plan:  H&P reviewed both conditions and discussed capsulitis and went ahead today did a proximal nerve block of the area 60 mg like Marcaine mixture sterile prep aspirated the joint getting a small amount of clear fluid injected quarter cc dexamethasone Kenalog into the joint applied thick padding to take pressure off the joint surface and advised on rigid bottom shoes.  Reappoint to recheck 3 weeks or earlier if needed may require orthotics  X-rays indicate there has been previous bunion surgery right with 2 screws and there is a small spur on the medial side of the first metatarsal head which has grown since 2017

## 2022-05-05 ENCOUNTER — Other Ambulatory Visit: Payer: Self-pay | Admitting: Podiatry

## 2022-05-05 DIAGNOSIS — M779 Enthesopathy, unspecified: Secondary | ICD-10-CM

## 2022-05-27 ENCOUNTER — Ambulatory Visit (INDEPENDENT_AMBULATORY_CARE_PROVIDER_SITE_OTHER): Payer: BC Managed Care – PPO | Admitting: Podiatry

## 2022-05-27 ENCOUNTER — Encounter: Payer: Self-pay | Admitting: Podiatry

## 2022-05-27 DIAGNOSIS — M7751 Other enthesopathy of right foot: Secondary | ICD-10-CM | POA: Diagnosis not present

## 2022-05-28 NOTE — Progress Notes (Signed)
Subjective:   Patient ID: Dawn Bonilla, female   DOB: 61 y.o.   MRN: [id number]   HPI Patient states the pain has reduced quite a bit and the padding seems to help along with shoe gear modifications   ROS      Objective:  Physical Exam  Neurovascular status intact with patient's right forefoot showing reduced discomfort around the MPJ there is still slight fluid noted but it has improved and there is no current restriction of motion of the joint surface     Assessment:  Inflammatory capsulitis of the second MPJ right that is improved with conservative care with padding still being used     Plan:  Dispense several pads continue to use when exercising continue shoe gear modifications discussed possible orthotics if symptoms were to persist or other treatment if we continue to have issues.  Reappoint for [place] to recheck

## 2022-08-24 DIAGNOSIS — Z1231 Encounter for screening mammogram for malignant neoplasm of breast: Secondary | ICD-10-CM | POA: Diagnosis not present

## 2022-08-28 DIAGNOSIS — N6002 Solitary cyst of left breast: Secondary | ICD-10-CM | POA: Diagnosis not present

## 2022-08-28 DIAGNOSIS — R928 Other abnormal and inconclusive findings on diagnostic imaging of breast: Secondary | ICD-10-CM | POA: Diagnosis not present

## 2022-08-28 DIAGNOSIS — Z803 Family history of malignant neoplasm of breast: Secondary | ICD-10-CM | POA: Diagnosis not present

## 2022-09-22 DIAGNOSIS — Z0000 Encounter for general adult medical examination without abnormal findings: Secondary | ICD-10-CM | POA: Diagnosis not present

## 2022-09-22 DIAGNOSIS — Z79899 Other long term (current) drug therapy: Secondary | ICD-10-CM | POA: Diagnosis not present

## 2022-09-22 DIAGNOSIS — E559 Vitamin D deficiency, unspecified: Secondary | ICD-10-CM | POA: Diagnosis not present

## 2022-09-22 DIAGNOSIS — Z23 Encounter for immunization: Secondary | ICD-10-CM | POA: Diagnosis not present

## 2022-09-22 DIAGNOSIS — E785 Hyperlipidemia, unspecified: Secondary | ICD-10-CM | POA: Diagnosis not present

## 2022-10-28 ENCOUNTER — Ambulatory Visit (INDEPENDENT_AMBULATORY_CARE_PROVIDER_SITE_OTHER): Payer: BC Managed Care – PPO | Admitting: Podiatry

## 2022-10-28 ENCOUNTER — Encounter: Payer: Self-pay | Admitting: Podiatry

## 2022-10-28 DIAGNOSIS — M7751 Other enthesopathy of right foot: Secondary | ICD-10-CM

## 2022-10-28 DIAGNOSIS — M7752 Other enthesopathy of left foot: Secondary | ICD-10-CM

## 2022-10-29 NOTE — Progress Notes (Signed)
Subjective:   Patient ID: Dawn Bonilla, female   DOB: 61 y.o.   MRN: [id number]   HPI Patient states that she has done fairly well with the previous treatment but she still gets pain in her right forefoot and states especially with exercise   ROS      Objective:  Physical Exam  Neurovascular status intact with patient's right forefoot around the second MPJ showing inflammation and moderate instability     Assessment:  Chronic instability of both feet history of bunion surgery with inflammatory capsulitis second MPJ right     Plan:  I would like to hold off on injection currently and I did apply metatarsal padding and I then casted for functional orthotic devices to offload the second MPJ right foot.  I explained how this is done patient will be seen back and will wear rigid bottom shoes as best she can until we get the orthotics back

## 2022-11-05 ENCOUNTER — Encounter: Payer: Self-pay | Admitting: Physical Medicine & Rehabilitation

## 2022-12-08 NOTE — Progress Notes (Signed)
Patient presents today to pick up custom molded foot orthotics recommended by Dr. Paulla Dolly.   Orthotics were dispensed and fit was satisfactory. Reviewed instructions for break-in and wear. Written instructions given to patient.  Patient will follow up as needed.   [facility] - order # [id number]

## 2022-12-09 ENCOUNTER — Ambulatory Visit (INDEPENDENT_AMBULATORY_CARE_PROVIDER_SITE_OTHER): Payer: BC Managed Care – PPO

## 2022-12-09 DIAGNOSIS — M7751 Other enthesopathy of right foot: Secondary | ICD-10-CM

## 2023-01-27 ENCOUNTER — Encounter
Payer: BC Managed Care – PPO | Attending: Physical Medicine & Rehabilitation | Admitting: Physical Medicine & Rehabilitation

## 2023-01-27 ENCOUNTER — Encounter: Payer: Self-pay | Admitting: Physical Medicine & Rehabilitation

## 2023-01-27 VITALS — BP 121/81 | HR 78 | Ht 66.5 in

## 2023-01-27 DIAGNOSIS — M47816 Spondylosis without myelopathy or radiculopathy, lumbar region: Secondary | ICD-10-CM | POA: Insufficient documentation

## 2023-01-27 DIAGNOSIS — M7918 Myalgia, other site: Secondary | ICD-10-CM | POA: Insufficient documentation

## 2023-01-27 DIAGNOSIS — M79643 Pain in unspecified hand: Secondary | ICD-10-CM | POA: Insufficient documentation

## 2023-01-27 DIAGNOSIS — M79641 Pain in right hand: Secondary | ICD-10-CM | POA: Insufficient documentation

## 2023-01-27 DIAGNOSIS — M79642 Pain in left hand: Secondary | ICD-10-CM | POA: Insufficient documentation

## 2023-01-27 MED ORDER — DICLOFENAC SODIUM 50 MG PO TBEC: 50.0000 mg | DELAYED_RELEASE_TABLET | Freq: Two times a day (BID) | ORAL | 3 refills | Status: DC

## 2023-01-27 NOTE — Patient Instructions (Addendum)
ALWAYS FEEL FREE TO CALL OUR OFFICE WITH ANY PROBLEMS OR QUESTIONS [phone]  **PLEASE NOTE** ALL MEDICATION REFILL REQUESTS (INCLUDING CONTROLLED SUBSTANCES) NEED TO BE MADE AT LEAST 7 DAYS PRIOR TO REFILL BEING DUE. ANY REFILL REQUESTS INSIDE THAT TIME FRAME MAY RESULT IN DELAYS IN RECEIVING YOUR PRESCRIPTION.    SUPPLEMENTS USEFUL FOR OSTEOARTHRITIS: OMEGA 3 FATTY ACIDS, TURMERIC, GINGER, TART CHERRY EXTRACT, CELERY SEED, GLUCOSAMINE WITH CHONDROITIN     TRY VOLTAREN GEL IF YOU'D LIKE.

## 2023-01-27 NOTE — Progress Notes (Signed)
Subjective:    Patient ID: Dawn Bonilla, female    DOB: Dec 29, 1960, 62 y.o.   MRN: [id number]  HPI  Dawn Bonilla who has a history of depression is here today at the request of Jonathon Jordan who has complaints of chronic hand pain as well as low back and multiple joint pain.   She has had hand/thumb pain for 20+ years. She has seen a hand specialist in the past (?[facility]). She has been told she has advanced arthritis but that it was too early for injections.. She never had any injections after either.  The pain can come on if she turns her thumb the wrong direction or grabs something too tight. Grip strength is decreased. She occasionally uses an ibuprofen. Voltaren gel hasn't demonstrated overwhelming results. Her massage therapist gives her the most relief with her hands.     Standing causes significant pain in her low back and right thigh after a few minutes. She can walk 30 minutes or so without having to stop. She struggles to stand after she sits for a prolonged period of time.  She has had MRI's of her back the most recent of which was in march of 2023.  MRI was read as having evidence of advanced spinal stenosis at L4-5 secondary to facet arthritis as well as disc space narrowing and disc bulging with left paracentral disc protrusion that could affect the left more than right S1 nerve roots at L5-S1.  She has seen Dr. Ron Agee in the past. Has been treated for SIJ as well ast ITB syndrome. She has had facet injections by him in the past (at least 6+ years ago). She had a month or two of relief. She doesn't think she had RF's.  Most recently he gave her oral steroids. For relief of severe pain she will use '600mg'$  ibuprofen as needed. She has used icy hot patches with lidocaine, heating pads, sometimes ice for pain. She will sleep on her lef side with a pillow between her legs. She has been on cymbalta chronically for cLBP which helped her a lot initially.. Her massage therapist also works on her  back and leg.     Pain Inventory Average Pain 3 Pain Right Now 2 My pain is intermittent, dull, and aching  In the last 24 hours, has pain interfered with the following? General activity 3 Relation with others 0 Enjoyment of life 2 What TIME of day is your pain at its worst? daytime Sleep (in general) Good  Pain is worse with: walking Pain improves with: rest, therapy/exercise, and injections Relief from Meds: 3  walk without assistance how many minutes can you walk? 60 ability to climb steps?  yes do you drive?  yes Do you have any goals in this area?  yes  what is your job? No employed   No problems in this area  Any changes since last visit?  yes CT/MRI ( MRI about a year ago in the [facility])  Any changes since last visit?  no    Family History  Problem Relation Age of Onset   Hypertension Mother    Breast cancer Mother 34   Diabetes Mother        AODM   Lymphoma Mother    Hypertension Father    Heart disease Father    Hypertension Brother    Heart attack Brother 37   Breast cancer Sister 45   Osteoporosis Sister    Breast cancer Maternal Grandmother 87  Diabetes Maternal Grandmother    Social History   Socioeconomic History   Marital status: Married    Spouse name: Not on file   Number of children: Not on file   Years of education: Not on file   Highest education level: Not on file  Occupational History   Not on file  Tobacco Use   Smoking status: Never   Smokeless tobacco: Never  Vaping Use   Vaping Use: Never used  Substance and Sexual Activity   Alcohol use: Yes    Alcohol/week: 6.0 standard drinks of alcohol    Types: 3 Glasses of wine, 3 Standard drinks or equivalent per week    Comment: 3 glasses of wine per week   Drug use: No   Sexual activity: Yes    Partners: Male    Birth control/protection: Other-see comments, Post-menopausal    Comment: vasectomy  Other Topics Concern   Not on file  Social History Narrative   Not on  file   Social Determinants of Health   Financial Resource Strain: Not on file  Food Insecurity: Not on file  Transportation Needs: Not on file  Physical Activity: Not on file  Stress: Not on file  Social Connections: Not on file   Past Surgical History:  Procedure Laterality Date   [place]     age 73   Past Medical History:  Diagnosis Date   Bulging lumbar disc    BP 121/81   Pulse 78   Ht 5' 6.5" (1.689 m)   LMP 04/24/2011   SpO2 96%   BMI 22.26 kg/m   Opioid Risk Score:   Fall Risk Score:  `1  Depression screen [facility] 2/9     01/27/2023    1:17 PM  Depression screen PHQ 2/9  Decreased Interest 0  Down, Depressed, Hopeless 0  PHQ - 2 Score 0  Altered sleeping 0  Tired, decreased energy 0  Change in appetite 0  Feeling bad or failure about yourself  0  Trouble concentrating 0  Moving slowly or fidgety/restless 0  Suicidal thoughts 0  PHQ-9 Score 0    Review of Systems  Musculoskeletal:        Right hip, right buttock, right leg pain, pain in both thumb joints  All other systems reviewed and are negative.      Objective:   Physical Exam Gen: no distress, normal appearing HEENT: oral mucosa pink and moist, NCAT Cardio: Reg rate Chest: normal effort, normal rate of breathing Abd: soft, non-distended Ext: no edema Psych: pleasant, normal affect Skin: intact Neuro: Alert and oriented x 3. Normal insight and awareness. Intact Memory. Normal language and speech. Cranial nerve exam unremarkable. Motor 5/5 except for grip in left hand. Musculoskeletal: Patient with tenderness to palpation over the lower lumbar spine.  Pain is worse with extension as well as facet maneuver to the right side.  She was able to bend at the waist and touch the ground with little effort.  There was some pain when she came back to neutral extension.  There is some pain over the gluteal areas and piriformis on the right side just behind  the greater trochanter.  There was some mild pain with palpation on the trochanter itself.  Figure-of-four testing cause some pain in the lateral hip and groin to an extent but it appeared to be more muscle tightness.  There is no pain in the SI joint area with this maneuver.  Straight leg testing and seated slump testing did not provoke pain.  She ambulates with mild antalgia on the right side.  There is mild tenderness with palpation over both first MCPs and [place] areas.  Left is more affected than the right.  He might have been some pain over the extensor tendons of the thumb of the left hand as well.  There is no obvious swelling.  There may be some slight joint deformity at both first MCPs.       Assessment & Plan:   Chronic low back pain consistent with lumbar facet arthropathy at L4-L5.  Patient also with generalized lumbar spondylosis by report. Right buttock pain consistent with piriformis pain.  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  May have some symptoms consistent with de Quervain's tenosynovitis on the left side.   Plan  Made referral to radiology at [facility] for x-rays of both hands to assess the first [facility] and MCP of both hands. Made referral to outpatient physical therapy and [address] to address facet related back pain as well as tight piriformis muscle.  Focus should be on lengthening of the spine as well as flexion and generalized stretching.  Modalities as indicated.  I like with her to have a modified home exercise and stretching program at the end of her course. Prescription of right hip diclofenac 50 mg twice daily with food.  May also use topical diclofenac for her hands if she would like.  She has allergies to other NSAIDs but has tolerated Voltaren gel as well as oral ibuprofen in the past. Provided patient a list of anti-inflammatory supplements including  turmeric with curcumin, garlic, tart cherry extract, glucosamine with chondroitin, etc. Would like a copy of her MRI from [place] for assessment as well could consider medial branch blocks and or radiofrequency ablations in the future depending upon her course.  About 50 minutes was spent with the patient today in the office discussing her case, and examination, and developing a customized treatment plan.  I will see her back in about 2 months when she has had time to complete therapies and some of the items above.  I to thank Dr. Stephanie Acre for referring this pleasant lady to our practice.  I feel that we can help her.

## 2023-02-09 ENCOUNTER — Ambulatory Visit (HOSPITAL_BASED_OUTPATIENT_CLINIC_OR_DEPARTMENT_OTHER)
Admission: RE | Admit: 2023-02-09 | Discharge: 2023-02-09 | Disposition: A | Discharge status: Home / Self Care | Payer: BC Managed Care – PPO | Source: Ambulatory Visit | Attending: Physical Medicine & Rehabilitation | Admitting: Physical Medicine & Rehabilitation

## 2023-02-09 DIAGNOSIS — M79642 Pain in left hand: Secondary | ICD-10-CM | POA: Diagnosis not present

## 2023-02-09 DIAGNOSIS — M79641 Pain in right hand: Secondary | ICD-10-CM | POA: Insufficient documentation

## 2023-02-09 DIAGNOSIS — M19041 Primary osteoarthritis, right hand: Secondary | ICD-10-CM | POA: Diagnosis not present

## 2023-02-09 DIAGNOSIS — M19042 Primary osteoarthritis, left hand: Secondary | ICD-10-CM | POA: Diagnosis not present

## 2023-02-11 ENCOUNTER — Telehealth: Payer: Self-pay

## 2023-02-11 NOTE — Telephone Encounter (Signed)
Dawn Bonilla stated she was returning your call to review her x-ray report.  She will be available for call back on tomorrow 02/12/2023. Please call anytime after 11:00 AM on phone [phone].

## 2023-03-03 ENCOUNTER — Ambulatory Visit (HOSPITAL_BASED_OUTPATIENT_CLINIC_OR_DEPARTMENT_OTHER): Payer: BC Managed Care – PPO | Attending: Physical Medicine & Rehabilitation | Admitting: Physical Therapy

## 2023-03-03 ENCOUNTER — Other Ambulatory Visit: Payer: Self-pay

## 2023-03-03 DIAGNOSIS — M5459 Other low back pain: Secondary | ICD-10-CM | POA: Insufficient documentation

## 2023-03-03 DIAGNOSIS — M7918 Myalgia, other site: Secondary | ICD-10-CM | POA: Diagnosis not present

## 2023-03-03 DIAGNOSIS — R2689 Other abnormalities of gait and mobility: Secondary | ICD-10-CM | POA: Insufficient documentation

## 2023-03-03 DIAGNOSIS — M47816 Spondylosis without myelopathy or radiculopathy, lumbar region: Secondary | ICD-10-CM | POA: Insufficient documentation

## 2023-03-03 NOTE — Therapy (Unsigned)
OUTPATIENT PHYSICAL THERAPY THORACOLUMBAR EVALUATION   Patient Name: Dawn Bonilla MRN: [id number] DOB:February 20, 1961, 62 y.o., female Today's Date: 03/03/2023  END OF SESSION:   Past Medical History:  Diagnosis Date   Bulging lumbar disc    Past Surgical History:  Procedure Laterality Date   CESAREAN SECTION  1995   TONSILLECTOMY AND ADENOIDECTOMY     age 83   Patient Active Problem List   Diagnosis Date Noted   Hand pain 01/27/2023   Lumbar facet arthropathy 01/27/2023   Piriformis muscle pain 01/27/2023   Family history of breast cancer 05/04/2022   Hearing loss 05/04/2022   Hyperlipidemia, unspecified 05/04/2022   Joint pain 05/04/2022   Major depression 05/04/2022   Personal history of colonic polyps 05/04/2022   Vitamin D deficiency 05/04/2022   Arthritis 02/02/2012   SI (sacroiliac) joint dysfunction 02/02/2012    PCP: ***  REFERRING PROVIDER: ***  REFERRING DIAG: ***  Rationale for Evaluation and Treatment: {HABREHAB:27488}  THERAPY DIAG:  No diagnosis found.  ONSET DATE: last 6 month   SUBJECTIVE:                                                                                                                                                                                           SUBJECTIVE STATEMENT: Ongoing back problem   PERTINENT HISTORY:  ***  PAIN:  Are you having pain? Yes: NPRS scale: 0/10 Pain location: IT band  Pain description: aching Aggravating factors: *** Relieving factors: pain meds  PRECAUTIONS: {Therapy precautions:24002}  WEIGHT BEARING RESTRICTIONS: {Yes ***/No:24003}  FALLS:  Has patient fallen in last 6 months? No  LIVING ENVIRONMENT: Lives with: lives with their family Lives in: {Lives in:25570} Stairs: {opstairs:27293} Has following equipment at home: None  OCCUPATION: no  PLOF: Independent  PATIENT GOALS: less pain   NEXT MD VISIT: ***  OBJECTIVE:   DIAGNOSTIC FINDINGS:  ***  PATIENT SURVEYS:   {rehab surveys:24030}  SCREENING FOR RED FLAGS: Bowel or bladder incontinence: No Spinal tumors: No Cauda equina syndrome: No Compression fracture: No Abdominal aneurysm: No  COGNITION: Overall cognitive status: Within functional limits for tasks assessed     SENSATION: WFL  MUSCLE LENGTH: Hamstrings: Right *** deg; Left *** deg Thomas test: Right *** deg; Left *** deg  POSTURE: {posture:25561}  PALPATION: ***  LUMBAR ROM:   AROM eval  Flexion Full mild pain   Extension Full mild pain   Right lateral flexion No pain   Left lateral flexion Painful on the right   Right rotation   Left rotation No pain    (Blank rows = not tested)  LOWER EXTREMITY ROM:  Active  Right eval Left eval  Hip flexion    Hip extension    Hip abduction    Hip adduction    Hip internal rotation    Hip external rotation    Knee flexion    Knee extension    Ankle dorsiflexion    Ankle plantarflexion    Ankle inversion    Ankle eversion     (Blank rows = not tested)  LOWER EXTREMITY MMT:    MMT Right eval Left eval  Hip flexion 22.6 22.5  Hip extension    Hip abduction 28.6 29.0  Hip adduction    Hip internal rotation    Hip external rotation    Knee flexion    Knee extension 35.5 40.1  Ankle dorsiflexion    Ankle plantarflexion    Ankle inversion    Ankle eversion     (Blank rows = not tested)  LUMBAR SPECIAL TESTS:  {lumbar special test:25242}  FUNCTIONAL TESTS:  {Functional tests:24029}  GAIT: Patient is ambulating without any AD.  TODAY'S TREATMENT:                                                                                                                              DATE: ***    PATIENT EDUCATION:  Education details: *** Person educated: Patient Education method: Explanation, Demonstration, Actor cues, Verbal cues, and Handouts Education comprehension: verbalized understanding, returned demonstration, verbal cues required, tactile cues  required, and needs further education  HOME EXERCISE PROGRAM: Access Code: [facility] URL: [email or web address] Date: 03/03/2023 Prepared by: Lorayne Bender  Exercises - Supine Piriformis Stretch with Foot on Ground  - 1 x daily - 7 x weekly - 3 sets - 10 reps - Child's Pose Stretch  - 1 x daily - 7 x weekly - 3 sets - 10 reps - Child's Pose with Sidebending  - 1 x daily - 7 x weekly - 3 sets - 10 reps - Standing Glute Med Mobilization with Small Ball on Wall  - 1 x daily - 7 x weekly - 3 sets - 10 reps  ASSESSMENT:  CLINICAL IMPRESSION: [name] who was seen today for physical therapy evaluation and treatment for low back pain and IT band.   OBJECTIVE IMPAIRMENTS: {opptimpairments:25111}.   ACTIVITY LIMITATIONS: {activitylimitations:27494}  PARTICIPATION LIMITATIONS: {participationrestrictions:25113}  PERSONAL FACTORS: {Personal factors:25162} are also affecting patient's functional outcome.   REHAB POTENTIAL: {rehabpotential:25112}  CLINICAL DECISION MAKING: {clinical decision making:25114}  EVALUATION COMPLEXITY: {Evaluation complexity:25115}   GOALS: Goals reviewed with patient? {yes/no:20286}  SHORT TERM GOALS: Target date: ***  *** Baseline: Goal status: {GOALSTATUS:25110}  2.  *** Baseline:  Goal status: {GOALSTATUS:25110}  3.  *** Baseline:  Goal status: {GOALSTATUS:25110}  4.  *** Baseline:  Goal status: {GOALSTATUS:25110}  5.  *** Baseline:  Goal status: {GOALSTATUS:25110}  6.  *** Baseline:  Goal status: {GOALSTATUS:25110}  LONG TERM GOALS: Target date: ***  *** Baseline:  Goal status: {  GOALSTATUS:25110}  2.  *** Baseline:  Goal status: {GOALSTATUS:25110}  3.  *** Baseline:  Goal status: {GOALSTATUS:25110}  4.  *** Baseline:  Goal status: {GOALSTATUS:25110}  5.  *** Baseline:  Goal status: {GOALSTATUS:25110}  6.  *** Baseline:  Goal status: {GOALSTATUS:25110}  PLAN:  PT FREQUENCY:  {rehab frequency:25116}  PT DURATION: {rehab duration:25117}  PLANNED INTERVENTIONS: {rehab planned interventions:25118::"Therapeutic exercises","Therapeutic activity","Neuromuscular re-education","Balance training","Gait training","Patient/Family education","Self Care","Joint mobilization"}.  PLAN FOR NEXT SESSION: ***   Dessie ComaDavid J Dimples Probus, PT 03/03/2023, 2:34 PM

## 2023-03-04 ENCOUNTER — Encounter (HOSPITAL_BASED_OUTPATIENT_CLINIC_OR_DEPARTMENT_OTHER): Payer: Self-pay | Admitting: Physical Therapy

## 2023-03-04 NOTE — Therapy (Signed)
OUTPATIENT PHYSICAL THERAPY THORACOLUMBAR EVALUATION   Patient Name: Dawn Bonilla MRN: [id number] DOB:05/20/1961, 62 y.o., female Today's Date: 03/04/2023  END OF SESSION:   Past Medical History:  Diagnosis Date   Bulging lumbar disc    Past Surgical History:  Procedure Laterality Date   CESAREAN SECTION  1995   TONSILLECTOMY AND ADENOIDECTOMY     age 62   Patient Active Problem List   Diagnosis Date Noted   Hand pain 01/27/2023   Lumbar facet arthropathy 01/27/2023   Piriformis muscle pain 01/27/2023   Family history of breast cancer 05/04/2022   Hearing loss 05/04/2022   Hyperlipidemia, unspecified 05/04/2022   Joint pain 05/04/2022   Major depression 05/04/2022   Personal history of colonic polyps 05/04/2022   Vitamin D deficiency 05/04/2022   Arthritis 02/02/2012   SI (sacroiliac) joint dysfunction 02/02/2012    PCP: Mila Palmer, MD  REFERRING PROVIDER: Faith Rogue, MD  REFERRING DIAG:  [phone] (ICD-10-CM) - Lumbar facet arthropathy  M79.18 (ICD-10-CM) - Piriformis muscle pain    Rationale for Evaluation and Treatment: Rehabilitation  THERAPY DIAG:  No diagnosis found.  ONSET DATE: last 6 months   SUBJECTIVE:                                                                                                                                                                                           SUBJECTIVE STATEMENT: Patient is a 62 y/o women who is physically active in the gym who has been experiencing ongoing back pain. She reports a long history of intermittent back pain.  Patient has noticed that over the last 6 months it has interfere with her workouts and yoga sessions. Her pain increases with activity. She also has pain down her IT band. She is very active. She does small group workouts 3x a week as well as yoga 2x a week.There are no constant triggers to the pain.    PERTINENT HISTORY:  Lumbar facet arthropathy   PAIN:  Are you having pain?  Yes: NPRS scale: 0/10 Pain location: IT band  Pain description: aching Aggravating factors: bothers her at times when she works out Relieving factors: pain meds  PRECAUTIONS: None  WEIGHT BEARING RESTRICTIONS: No  FALLS:  Has patient fallen in last 6 months? No  LIVING ENVIRONMENT: Lives with: lives with their family Lives in: House/apartment Stairs: No Has following equipment at home: None  OCCUPATION: N/A  PLOF: Independent  PATIENT GOALS: less pain   NEXT MD VISIT: 5/8  OBJECTIVE:   DIAGNOSTIC FINDINGS:  Tight glutes and IT band   PATIENT SURVEYS:  FOTO    SCREENING FOR RED  FLAGS: Bowel or bladder incontinence: No Spinal tumors: No Cauda equina syndrome: No Compression fracture: No Abdominal aneurysm: No  COGNITION: Overall cognitive status: Within functional limits for tasks assessed     SENSATION: WFL  MUSCLE LENGTH:   POSTURE: No Significant postural limitations  PALPATION: Tightness in low back and gluteal region  LUMBAR ROM:   AROM eval  Flexion Full mild pain   Extension Full mild pain   Right lateral flexion No pain   Left lateral flexion Painful on the right   Right rotation   Left rotation No pain    (Blank rows = not tested)  LOWER EXTREMITY ROM:     Active  Right eval Left eval  Hip flexion    Hip extension    Hip abduction    Hip adduction    Hip internal rotation    Hip external rotation    Knee flexion    Knee extension    Ankle dorsiflexion    Ankle plantarflexion    Ankle inversion    Ankle eversion     (Blank rows = not tested)  LOWER EXTREMITY MMT:    MMT Right eval Left eval  Hip flexion 22.6 22.5  Hip extension    Hip abduction 28.6 29.0  Hip adduction    Hip internal rotation    Hip external rotation    Knee flexion    Knee extension 35.5 40.1  Ankle dorsiflexion    Ankle plantarflexion    Ankle inversion    Ankle eversion     (Blank rows = not tested)  LUMBAR SPECIAL TESTS:     FUNCTIONAL TESTS:    GAIT: Patient is ambulating without any AD.  TODAY'S TREATMENT:                                                                                                                              DATE:   4/11  Piriformis stretch 3x30sec Child pose stretch x10 5 second hold Child pose sidebending x10 5 sec hold Standing glute med mob with tennis ball x20 sec  PATIENT EDUCATION:  Education details: POC, Symptom management, HEP Person educated: Patient Education method: Explanation, Demonstration, Tactile cues, Verbal cues, and Handouts Education comprehension: verbalized understanding, returned demonstration, verbal cues required, tactile cues required, and needs further education  HOME EXERCISE PROGRAM: [email or web address] URL: [email or web address] Date: 03/03/2023 Prepared by: Lorayne Benderavid Carroll  Exercises - Supine Piriformis Stretch with Foot on Ground  - 1 x daily - 7 x weekly - 3 sets - 10 reps - Child's Pose Stretch  - 1 x daily - 7 x weekly - 3 sets - 10 reps - Child's Pose with Sidebending  - 1 x daily - 7 x weekly - 3 sets - 10 reps - Standing Glute Med Mobilization with Small Ball on Wall  - 1 x daily - 7 x weekly - 3 sets - 10 reps  ASSESSMENT:  CLINICAL IMPRESSION: Patient is  a 62 y.o. female who was seen today for physical therapy evaluation and treatment for low back pain and IT band tightness. Patient PROM are [facility] but does experience minor pain and tightness at end range for lumbar flexion and extension. Patient has fairly equal strength bilaterally. She has spasming in her right gluteal and trigger points in her lateral IT band.  Patient will benefit from skilled physical therapy to decrease tightness and discomfort to be able to do her hobbies, ADL's and yoga sessions without limitations.   OBJECTIVE IMPAIRMENTS: decreased activity tolerance and decreased endurance.   ACTIVITY LIMITATIONS: carrying, lifting, bending, sitting, and  squatting  PARTICIPATION LIMITATIONS: meal prep, cleaning, laundry, shopping, community activity, occupation, and yard work  PERSONAL FACTORS: 1 comorbidity: Lumbar facet arthropathy   are also affecting patient's functional outcome.   REHAB POTENTIAL: Excellent  CLINICAL DECISION MAKING: Stable/uncomplicated  EVALUATION COMPLEXITY: Low   GOALS: Goals reviewed with patient? Yes  SHORT TERM GOALS: Target date: 4/24  Patient will be independent HEP. Baseline: Goal status: INITIAL  2.  Patient will increase strength bilateral by 5lbs Baseline:  Goal status: INITIAL  3.  Patient will report a 50% reduction in intensity and frequency of symptoms Baseline:  Goal status: INITIAL    LONG TERM GOALS: Target date: 03/31/23  Patient will be able to do her yoga sessions without discomfort. Baseline:  Goal status: INITIAL  2.  Patient will be able to extend and flex without end range tightness in order to pick items up without pain .  Baseline:  Goal status: INITIAL   PLAN:  PT FREQUENCY: 1x/week  PT DURATION: 6 weeks  PLANNED INTERVENTIONS: Therapeutic exercises, Therapeutic activity, Neuromuscular re-education, Balance training, Gait training, Patient/Family education, Self Care, Joint mobilization, Joint manipulation, Aquatic Therapy, Dry Needling, Electrical stimulation, Cryotherapy, Moist heat, Ultrasound, Ionotophoresis 4mg /ml Dexamethasone, and Manual therapy.  PLAN FOR NEXT SESSION: Consider quadruped series, LTR, clamshells, single/double leg bridge, SLR. Consider STM on glute or dry needling. Review technique with gym activity   Cristal Ford, Student-PT 03/04/2023, 9:07 AM   Lorayne Bender PT DPT   I have reviewed and concur with this student's documentation.   Dessie Coma, PT 03/04/2023 8:35 PM

## 2023-03-11 ENCOUNTER — Ambulatory Visit (HOSPITAL_BASED_OUTPATIENT_CLINIC_OR_DEPARTMENT_OTHER): Payer: BC Managed Care – PPO | Admitting: Physical Therapy

## 2023-03-11 ENCOUNTER — Encounter (HOSPITAL_BASED_OUTPATIENT_CLINIC_OR_DEPARTMENT_OTHER): Payer: Self-pay | Admitting: Physical Therapy

## 2023-03-11 DIAGNOSIS — R2689 Other abnormalities of gait and mobility: Secondary | ICD-10-CM

## 2023-03-11 DIAGNOSIS — M5459 Other low back pain: Secondary | ICD-10-CM

## 2023-03-11 DIAGNOSIS — M47816 Spondylosis without myelopathy or radiculopathy, lumbar region: Secondary | ICD-10-CM | POA: Diagnosis not present

## 2023-03-11 DIAGNOSIS — M7918 Myalgia, other site: Secondary | ICD-10-CM | POA: Diagnosis not present

## 2023-03-11 NOTE — Therapy (Signed)
OUTPATIENT PHYSICAL THERAPY THORACOLUMBAR EVALUATION   Patient Name: Dawn Bonilla MRN: [id number] DOB:May 11, 1961, 62 y.o., female Today's Date: 03/11/2023  END OF SESSION:  PT End of Session - 03/11/23 1315     Visit Number 2    Number of Visits 12    Date for PT Re-Evaluation 04/15/23    PT Start Time 0930    PT Stop Time 1013    PT Time Calculation (min) 43 min    Activity Tolerance Patient tolerated treatment well    Behavior During Therapy [facility] for tasks assessed/performed             Past Medical History:  Diagnosis Date   Bulging lumbar disc    Past Surgical History:  Procedure Laterality Date   CESAREAN SECTION  1995   TONSILLECTOMY AND ADENOIDECTOMY     age 4   Patient Active Problem List   Diagnosis Date Noted   Hand pain 01/27/2023   Lumbar facet arthropathy 01/27/2023   Piriformis muscle pain 01/27/2023   Family history of breast cancer 05/04/2022   Hearing loss 05/04/2022   Hyperlipidemia, unspecified 05/04/2022   Joint pain 05/04/2022   Major depression 05/04/2022   Personal history of colonic polyps 05/04/2022   Vitamin D deficiency 05/04/2022   Arthritis 02/02/2012   SI (sacroiliac) joint dysfunction 02/02/2012    PCP: Mila Palmer, MD  REFERRING PROVIDER: Faith Rogue, MD  REFERRING DIAG:  [phone] (ICD-10-CM) - Lumbar facet arthropathy  M79.18 (ICD-10-CM) - Piriformis muscle pain    Rationale for Evaluation and Treatment: Rehabilitation  THERAPY DIAG:  Other low back pain  Other abnormalities of gait and mobility  ONSET DATE: last 6 months   SUBJECTIVE:                                                                                                                                                                                           SUBJECTIVE STATEMENT: Patient reports her back is about the same. She is having pain with activity. She has been working out and going to yoga.     Eval:Patient is a 62 y/o women who is  physically active in the gym who has been experiencing ongoing back pain. She reports a long history of intermittent back pain.  Patient has noticed that over the last 6 months it has interfere with her workouts and yoga sessions. Her pain increases with activity. She also has pain down her IT band. She is very active. She does small group workouts 3x a week as well as yoga 2x a week.There are no constant triggers to the pain.    PERTINENT HISTORY:  Lumbar facet arthropathy   PAIN:  Are you having pain? Yes: NPRS scale: 4-5 at best /10 Pain location: low back  Pain description: aching Aggravating factors: bothers her at times when she works out Relieving factors: pain meds  PRECAUTIONS: None  WEIGHT BEARING RESTRICTIONS: No  FALLS:  Has patient fallen in last 6 months? No  LIVING ENVIRONMENT: Lives with: lives with their family Lives in: House/apartment Stairs: No Has following equipment at home: None  OCCUPATION: N/A  PLOF: Independent  PATIENT GOALS: less pain   NEXT MD VISIT: 5/8  OBJECTIVE:   DIAGNOSTIC FINDINGS:  Tight glutes and IT band   PATIENT SURVEYS:  FOTO    SCREENING FOR RED FLAGS: Bowel or bladder incontinence: No Spinal tumors: No Cauda equina syndrome: No Compression fracture: No Abdominal aneurysm: No  COGNITION: Overall cognitive status: Within functional limits for tasks assessed     SENSATION: WFL  MUSCLE LENGTH:   POSTURE: No Significant postural limitations  PALPATION: Tightness in low back and gluteal region  LUMBAR ROM:   AROM eval  Flexion Full mild pain   Extension Full mild pain   Right lateral flexion No pain   Left lateral flexion Painful on the right   Right rotation   Left rotation No pain    (Blank rows = not tested)  LOWER EXTREMITY ROM:     Active  Right eval Left eval  Hip flexion full Full   Hip extension    Hip abduction    Hip adduction    Hip internal rotation    Hip external rotation    Knee  flexion    Knee extension    Ankle dorsiflexion    Ankle plantarflexion    Ankle inversion    Ankle eversion     (Blank rows = not tested)  LOWER EXTREMITY MMT:    MMT Right eval Left eval  Hip flexion 22.6 22.5  Hip extension    Hip abduction 28.6 29.0  Hip adduction    Hip internal rotation    Hip external rotation    Knee flexion    Knee extension 35.5 40.1  Ankle dorsiflexion    Ankle plantarflexion    Ankle inversion    Ankle eversion     (Blank rows = not tested)  LUMBAR SPECIAL TESTS:    FUNCTIONAL TESTS:    GAIT: Patient is ambulating without any AD.  TODAY'S TREATMENT:                                                                                                                              DATE:  4/18 Trigger Point Dry-Needling  Treatment instructions: Expect mild to moderate muscle soreness. S/S of pneumothorax if dry needled over a lung field, and to seek immediate medical attention should they occur. Patient verbalized understanding of these instructions and education.  Patient Consent Given: Yes Education handout provided: Yes Muscles treated: right lumbar paraspinals L3-L4 and L5 .30x50  for each  Electrical stimulation performed: No Parameters: N/A Treatment response/outcome: great twtich    Manual: trigger point dry needling to lumbar spine; roller to IT band; LAD to right LE  Reviewed self LAD using band and door   Reviewed technique with gym activity row 2x15 10 lbs with cuing for breathing with all  Shoulder extension 2x15 10 lbs   Hip abduction 55 lbs with abdominal breathing  Single leg stance cone drill x10 each leg      4/11  Piriformis stretch 3x30sec Child pose stretch x10 5 second hold Child pose sidebending x10 5 sec hold Standing glute med mob with tennis ball x20 sec  PATIENT EDUCATION:  Education details: POC, Symptom management, HEP, benefits and risks of TPDN  Person educated: Patient Education method: Explanation,  Demonstration, Tactile cues, Verbal cues, and Handouts Education comprehension: verbalized understanding, returned demonstration, verbal cues required, tactile cues required, and needs further education  HOME EXERCISE PROGRAM: Access Code: [facility] URL: [email or web address] Date: 03/03/2023 Prepared by: Lorayne Bender  Exercises - Supine Piriformis Stretch with Foot on Ground  - 1 x daily - 7 x weekly - 3 sets - 10 reps - Child's Pose Stretch  - 1 x daily - 7 x weekly - 3 sets - 10 reps - Child's Pose with Sidebending  - 1 x daily - 7 x weekly - 3 sets - 10 reps - Standing Glute Med Mobilization with Small Ball on Wall  - 1 x daily - 7 x weekly - 3 sets - 10 reps  ASSESSMENT:  CLINICAL IMPRESSION: Therapy performed a trial of TPDN today. She tolerated well. She had a great twitch. We performed LAD for decompression She was shown how to do on her own. We also reviewed core breathing with gym activity.  Therapy also reviewed a single leg drill with her. We will continue to progress as tolerated.  OBJECTIVE IMPAIRMENTS: decreased activity tolerance and decreased endurance.   ACTIVITY LIMITATIONS: carrying, lifting, bending, sitting, and squatting  PARTICIPATION LIMITATIONS: meal prep, cleaning, laundry, shopping, community activity, occupation, and yard work  PERSONAL FACTORS: 1 comorbidity: Lumbar facet arthropathy   are also affecting patient's functional outcome.   REHAB POTENTIAL: Excellent  CLINICAL DECISION MAKING: Stable/uncomplicated  EVALUATION COMPLEXITY: Low   GOALS: Goals reviewed with patient? Yes  SHORT TERM GOALS: Target date: 4/24  Patient will be independent HEP. Baseline: Goal status: INITIAL  2.  Patient will increase strength bilateral by 5lbs Baseline:  Goal status: INITIAL  3.  Patient will report a 50% reduction in intensity and frequency of symptoms Baseline:  Goal status: INITIAL    LONG TERM GOALS: Target date:  03/31/23  Patient will be able to do her yoga sessions without discomfort. Baseline:  Goal status: INITIAL  2.  Patient will be able to extend and flex without end range tightness in order to pick items up without pain .  Baseline:  Goal status: INITIAL   PLAN:  PT FREQUENCY: 1x/week  PT DURATION: 6 weeks  PLANNED INTERVENTIONS: Therapeutic exercises, Therapeutic activity, Neuromuscular re-education, Balance training, Gait training, Patient/Family education, Self Care, Joint mobilization, Joint manipulation, Aquatic Therapy, Dry Needling, Electrical stimulation, Cryotherapy, Moist heat, Ultrasound, Ionotophoresis 4mg /ml Dexamethasone, and Manual therapy.  PLAN FOR NEXT SESSION: Consider quadruped series, LTR, clamshells, single/double leg bridge, SLR. Consider STM on glute or dry needling. Review technique with gym activity   Dessie Coma, PT 03/11/2023, 1:36 PM   Cristal Ford SPT   I have reviewed and concur with  this student's documentation.   During this treatment session, the therapist was present, participating in and directing the treatment.

## 2023-03-17 ENCOUNTER — Encounter (HOSPITAL_BASED_OUTPATIENT_CLINIC_OR_DEPARTMENT_OTHER): Payer: Self-pay | Admitting: Physical Therapy

## 2023-03-17 ENCOUNTER — Ambulatory Visit (HOSPITAL_BASED_OUTPATIENT_CLINIC_OR_DEPARTMENT_OTHER): Payer: BC Managed Care – PPO | Admitting: Physical Therapy

## 2023-03-17 DIAGNOSIS — M5459 Other low back pain: Secondary | ICD-10-CM | POA: Diagnosis not present

## 2023-03-17 DIAGNOSIS — M7918 Myalgia, other site: Secondary | ICD-10-CM | POA: Diagnosis not present

## 2023-03-17 DIAGNOSIS — R2689 Other abnormalities of gait and mobility: Secondary | ICD-10-CM | POA: Diagnosis not present

## 2023-03-17 DIAGNOSIS — M47816 Spondylosis without myelopathy or radiculopathy, lumbar region: Secondary | ICD-10-CM | POA: Diagnosis not present

## 2023-03-17 NOTE — Therapy (Signed)
OUTPATIENT PHYSICAL THERAPY THORACOLUMBAR  Treatment   Patient Name: Dawn Bonilla MRN: [id number] DOB:[birth year], 62 y.o., female Today's Date: 03/11/2023  END OF SESSION:  PT End of Session - 03/11/23 1315     Visit Number 2    Number of Visits 12    Date for PT Re-Evaluation 04/15/23    PT Start Time 0930    PT Stop Time 1013    PT Time Calculation (min) 43 min    Activity Tolerance Patient tolerated treatment well    Behavior During Therapy [facility] for tasks assessed/performed             Past Medical History:  Diagnosis Date   Bulging lumbar disc    Past Surgical History:  Procedure Laterality Date   CESAREAN SECTION  1995   TONSILLECTOMY AND ADENOIDECTOMY     age 18   Patient Active Problem List   Diagnosis Date Noted   Hand pain 01/27/2023   Lumbar facet arthropathy 01/27/2023   Piriformis muscle pain 01/27/2023   Family history of breast cancer 05/04/2022   Hearing loss 05/04/2022   Hyperlipidemia, unspecified 05/04/2022   Joint pain 05/04/2022   Major depression 05/04/2022   Personal history of colonic polyps 05/04/2022   Vitamin D deficiency 05/04/2022   Arthritis 02/02/2012   SI (sacroiliac) joint dysfunction 02/02/2012    PCP: Mila Palmer, MD  REFERRING PROVIDER: Faith Rogue, MD  REFERRING DIAG:  [phone] (ICD-10-CM) - Lumbar facet arthropathy  M79.18 (ICD-10-CM) - Piriformis muscle pain    Rationale for Evaluation and Treatment: Rehabilitation  THERAPY DIAG:  Other low back pain  Other abnormalities of gait and mobility  ONSET DATE: last 6 months   SUBJECTIVE:                                                                                                                                                                                           SUBJECTIVE STATEMENT: Patient reports feeling better. Patient reports dry needling has helped. Patient has been able to work out and walk more frequently without increase of pain.      Eval:Patient is a 62 y/o women who is physically active in the gym who has been experiencing ongoing back pain. She reports a long history of intermittent back pain.  Patient has noticed that over the last 6 months it has interfere with her workouts and yoga sessions. Her pain increases with activity. She also has pain down her IT band. She is very active. She does small group workouts 3x a week as well as yoga 2x a week.There are no constant triggers to the pain.  PERTINENT HISTORY:  Lumbar facet arthropathy   PAIN:  Are you having pain? Yes: NPRS scale: 4-5 at best /10 Pain location: low back  Pain description: aching Aggravating factors: bothers her at times when she works out Relieving factors: pain meds  PRECAUTIONS: None  WEIGHT BEARING RESTRICTIONS: No  FALLS:  Has patient fallen in last 6 months? No  LIVING ENVIRONMENT: Lives with: lives with their family Lives in: House/apartment Stairs: No Has following equipment at home: None  OCCUPATION: N/A  PLOF: Independent  PATIENT GOALS: less pain   NEXT MD VISIT: 5/8  OBJECTIVE:   DIAGNOSTIC FINDINGS:  Tight glutes and IT band   PATIENT SURVEYS:  FOTO    SCREENING FOR RED FLAGS: Bowel or bladder incontinence: No Spinal tumors: No Cauda equina syndrome: No Compression fracture: No Abdominal aneurysm: No  COGNITION: Overall cognitive status: Within functional limits for tasks assessed     SENSATION: WFL  MUSCLE LENGTH:   POSTURE: No Significant postural limitations  PALPATION: Tightness in low back and gluteal region  LUMBAR ROM:   AROM eval  Flexion Full mild pain   Extension Full mild pain   Right lateral flexion No pain   Left lateral flexion Painful on the right   Right rotation   Left rotation No pain    (Blank rows = not tested)  LOWER EXTREMITY ROM:     Active  Right eval Left eval  Hip flexion full Full   Hip extension    Hip abduction    Hip adduction    Hip internal  rotation    Hip external rotation    Knee flexion    Knee extension    Ankle dorsiflexion    Ankle plantarflexion    Ankle inversion    Ankle eversion     (Blank rows = not tested)  LOWER EXTREMITY MMT:    MMT Right eval Left eval  Hip flexion 22.6 22.5  Hip extension    Hip abduction 28.6 29.0  Hip adduction    Hip internal rotation    Hip external rotation    Knee flexion    Knee extension 35.5 40.1  Ankle dorsiflexion    Ankle plantarflexion    Ankle inversion    Ankle eversion     (Blank rows = not tested)  LUMBAR SPECIAL TESTS:    FUNCTIONAL TESTS:    GAIT: Patient is ambulating without any AD.  TODAY'S TREATMENT:                                                                                                                              DATE: 4/24  Treatment instructions: Expect mild to moderate muscle soreness. S/S of pneumothorax if dry needled over a lung field, and to seek immediate medical attention should they occur. Patient verbalized understanding of these instructions and education.  Patient Consent Given: Yes Education handout provided: Yes Muscles treated: right lumbar paraspinals L3-L4 and L5 .30x50 for  each  Electrical stimulation performed: No Parameters: N/A Treatment response/outcome: great twtich  Manual: lumbar spine and right LAD  Quadruped:  Alt UE x5 each  Alt LE x5 each  Alt UE/LE         4/18 Trigger Point Dry-Needling  Treatment instructions: Expect mild to moderate muscle soreness. S/S of pneumothorax if dry needled over a lung field, and to seek immediate medical attention should they occur. Patient verbalized understanding of these instructions and education.  Patient Consent Given: Yes Education handout provided: Yes Muscles treated: right lumbar paraspinals L3-L4 and L5 .30x50 for each  Electrical stimulation performed: No Parameters: N/A Treatment response/outcome: great twtich    Manual: trigger point dry  needling to lumbar spine; roller to IT band; LAD to right LE  Reviewed self LAD using band and door   Reviewed technique with gym activity row 2x15 10 lbs with cuing for breathing with all  Shoulder extension 2x15 10 lbs   Hip abduction 55 lbs with abdominal breathing  Single leg stance cone drill x10 each leg      4/11  Piriformis stretch 3x30sec Child pose stretch x10 5 second hold Child pose sidebending x10 5 sec hold Standing glute med mob with tennis ball x20 sec  PATIENT EDUCATION:  Education details: POC, Symptom management, HEP, benefits and risks of TPDN  Person educated: Patient Education method: Explanation, Demonstration, Tactile cues, Verbal cues, and Handouts Education comprehension: verbalized understanding, returned demonstration, verbal cues required, tactile cues required, and needs further education  HOME EXERCISE PROGRAM: Access Code: [facility] URL: [email or web address] Date: 03/03/2023 Prepared by: Lorayne Bender  Exercises - Supine Piriformis Stretch with Foot on Ground  - 1 x daily - 7 x weekly - 3 sets - 10 reps - Child's Pose Stretch  - 1 x daily - 7 x weekly - 3 sets - 10 reps - Child's Pose with Sidebending  - 1 x daily - 7 x weekly - 3 sets - 10 reps - Standing Glute Med Mobilization with Small Ball on Wall  - 1 x daily - 7 x weekly - 3 sets - 10 reps  ASSESSMENT:  CLINICAL IMPRESSION: The patient had minimal trigger points today compared to the last visit. We performed needling again. She had a good twitch response. We also reviewed a quadruped progression to work on. We reviewed trigger point referral patterns with the patient as well. We will continue to progress as tolerated.    OBJECTIVE IMPAIRMENTS: decreased activity tolerance and decreased endurance.   ACTIVITY LIMITATIONS: carrying, lifting, bending, sitting, and squatting  PARTICIPATION LIMITATIONS: meal prep, cleaning, laundry, shopping, community activity, occupation,  and yard work  PERSONAL FACTORS: 1 comorbidity: Lumbar facet arthropathy   are also affecting patient's functional outcome.   REHAB POTENTIAL: Excellent  CLINICAL DECISION MAKING: Stable/uncomplicated  EVALUATION COMPLEXITY: Low   GOALS: Goals reviewed with patient? Yes  SHORT TERM GOALS: Target date: 4/24  Patient will be independent HEP. Baseline: Goal status: INITIAL  2.  Patient will increase strength bilateral by 5lbs Baseline:  Goal status: INITIAL  3.  Patient will report a 50% reduction in intensity and frequency of symptoms Baseline:  Goal status: INITIAL    LONG TERM GOALS: Target date: 03/31/23  Patient will be able to do her yoga sessions without discomfort. Baseline:  Goal status: INITIAL  2.  Patient will be able to extend and flex without end range tightness in order to pick items up without pain .  Baseline:  Goal status: INITIAL  PLAN:  PT FREQUENCY: 1x/week  PT DURATION: 6 weeks  PLANNED INTERVENTIONS: Therapeutic exercises, Therapeutic activity, Neuromuscular re-education, Balance training, Gait training, Patient/Family education, Self Care, Joint mobilization, Joint manipulation, Aquatic Therapy, Dry Needling, Electrical stimulation, Cryotherapy, Moist heat, Ultrasound, Ionotophoresis 4mg /ml Dexamethasone, and Manual therapy.  PLAN FOR NEXT SESSION: Consider quadruped series, LTR, clamshells, single/double leg bridge, SLR. Consider STM on glute or dry needling. Review technique with gym activity   Dessie Coma, PT 03/11/2023, 1:36 PM   Cristal Ford SPT   I have reviewed and concur with this student's documentation.   During this treatment session, the therapist was present, participating in and directing the treatment.

## 2023-03-25 ENCOUNTER — Ambulatory Visit (HOSPITAL_BASED_OUTPATIENT_CLINIC_OR_DEPARTMENT_OTHER): Payer: BC Managed Care – PPO | Attending: Physical Medicine & Rehabilitation | Admitting: Physical Therapy

## 2023-03-25 ENCOUNTER — Encounter (HOSPITAL_BASED_OUTPATIENT_CLINIC_OR_DEPARTMENT_OTHER): Payer: Self-pay | Admitting: Physical Therapy

## 2023-03-25 DIAGNOSIS — M5459 Other low back pain: Secondary | ICD-10-CM | POA: Diagnosis not present

## 2023-03-25 DIAGNOSIS — R2689 Other abnormalities of gait and mobility: Secondary | ICD-10-CM | POA: Insufficient documentation

## 2023-03-25 NOTE — Therapy (Addendum)
OUTPATIENT PHYSICAL THERAPY THORACOLUMBAR  Treatment/Discharge    Patient Name: Dawn Bonilla MRN: [id number] DOB:[birth year], 62 y.o., female Today's Date: 03/25/2023  END OF SESSION:  PT End of Session - 03/25/23 1251     Visit Number 4    Number of Visits 12    Date for PT Re-Evaluation 04/15/23    PT Start Time 1145    PT Stop Time 1227    PT Time Calculation (min) 42 min    Activity Tolerance Patient tolerated treatment well    Behavior During Therapy [facility] for tasks assessed/performed             Past Medical History:  Diagnosis Date   Bulging lumbar disc    Past Surgical History:  Procedure Laterality Date   CESAREAN SECTION  1995   TONSILLECTOMY AND ADENOIDECTOMY     age 37   Patient Active Problem List   Diagnosis Date Noted   Hand pain 01/27/2023   Lumbar facet arthropathy 01/27/2023   Piriformis muscle pain 01/27/2023   Family history of breast cancer 05/04/2022   Hearing loss 05/04/2022   Hyperlipidemia, unspecified 05/04/2022   Joint pain 05/04/2022   Major depression 05/04/2022   Personal history of colonic polyps 05/04/2022   Vitamin D deficiency 05/04/2022   Arthritis 02/02/2012   SI (sacroiliac) joint dysfunction 02/02/2012    PCP: Mila Palmer, MD  REFERRING PROVIDER: Faith Rogue, MD  REFERRING DIAG:  [phone] (ICD-10-CM) - Lumbar facet arthropathy  M79.18 (ICD-10-CM) - Piriformis muscle pain    Rationale for Evaluation and Treatment: Rehabilitation  THERAPY DIAG:  Other low back pain  Other abnormalities of gait and mobility  ONSET DATE: last 6 months   SUBJECTIVE:                                                                                                                                                                                           SUBJECTIVE STATEMENT: The patient reports mild tingling the other day when she was driving. She has othersie had no pain   Eval:Patient is a 62 y/o women who is physically active  in the gym who has been experiencing ongoing back pain. She reports a long history of intermittent back pain.  Patient has noticed that over the last 6 months it has interfere with her workouts and yoga sessions. Her pain increases with activity. She also has pain down her IT band. She is very active. She does small group workouts 3x a week as well as yoga 2x a week.There are no constant triggers to the pain.    PERTINENT HISTORY:  Lumbar facet arthropathy  PAIN:  Are you having pain? Yes: NPRS scale: 4-5 at best /10 Pain location: low back  Pain description: aching Aggravating factors: bothers her at times when she works out Relieving factors: pain meds  PRECAUTIONS: None  WEIGHT BEARING RESTRICTIONS: No  FALLS:  Has patient fallen in last 6 months? No  LIVING ENVIRONMENT: Lives with: lives with their family Lives in: House/apartment Stairs: No Has following equipment at home: None  OCCUPATION: N/A  PLOF: Independent  PATIENT GOALS: less pain   NEXT MD VISIT: 5/8  OBJECTIVE:   DIAGNOSTIC FINDINGS:  Tight glutes and IT band   PATIENT SURVEYS:  FOTO    SCREENING FOR RED FLAGS: Bowel or bladder incontinence: No Spinal tumors: No Cauda equina syndrome: No Compression fracture: No Abdominal aneurysm: No  COGNITION: Overall cognitive status: Within functional limits for tasks assessed     SENSATION: WFL  MUSCLE LENGTH:   POSTURE: No Significant postural limitations  PALPATION: Tightness in low back and gluteal region  LUMBAR ROM:   AROM eval  Flexion Full mild pain   Extension Full mild pain   Right lateral flexion No pain   Left lateral flexion Painful on the right   Right rotation   Left rotation No pain    (Blank rows = not tested)  LOWER EXTREMITY ROM:     Active  Right eval Left eval  Hip flexion full Full   Hip extension    Hip abduction    Hip adduction    Hip internal rotation    Hip external rotation    Knee flexion    Knee  extension    Ankle dorsiflexion    Ankle plantarflexion    Ankle inversion    Ankle eversion     (Blank rows = not tested)  LOWER EXTREMITY MMT:    MMT Right eval Left eval  Hip flexion 22.6 22.5  Hip extension    Hip abduction 28.6 29.0  Hip adduction    Hip internal rotation    Hip external rotation    Knee flexion    Knee extension 35.5 40.1  Ankle dorsiflexion    Ankle plantarflexion    Ankle inversion    Ankle eversion     (Blank rows = not tested)  LUMBAR SPECIAL TESTS:    FUNCTIONAL TESTS:    GAIT: Patient is ambulating without any AD.  TODAY'S TREATMENT:                                                                                                                              DATE: 5/2 Treatment instructions: Expect mild to moderate muscle soreness. S/S of pneumothorax if dry needled over a lung field, and to seek immediate medical attention should they occur. Patient verbalized understanding of these instructions and education.  Patient Consent Given: Yes Education handout provided: Yes Muscles treated: right lumbar paraspinals L3-L4 and L5 .30x50 for each;  3 spots int he right gluteal using  a .30x50 needle  Electrical stimulation performed: No Parameters: N/A Treatment response/outcome: great twtich  Manual: trigger point release to gluteal and lumbar spine   Goblet squat 2x15 10 lbs with cuing for technique  Dead lift from 6 inch surface 2x15 with cuing for technique 12lb kettle bell  TRX squat x10 with cuing for deep squat       4/24  Treatment instructions: Expect mild to moderate muscle soreness. S/S of pneumothorax if dry needled over a lung field, and to seek immediate medical attention should they occur. Patient verbalized understanding of these instructions and education.  Patient Consent Given: Yes Education handout provided: Yes Muscles treated: right lumbar paraspinals L3-L4 and L5 .30x50 for each  Electrical stimulation performed:  No Parameters: N/A Treatment response/outcome: great twtich  Manual: lumbar spine and right LAD  Quadruped:  Alt UE x5 each  Alt LE x5 each  Alt UE/LE         4/18 Trigger Point Dry-Needling  Treatment instructions: Expect mild to moderate muscle soreness. S/S of pneumothorax if dry needled over a lung field, and to seek immediate medical attention should they occur. Patient verbalized understanding of these instructions and education.  Patient Consent Given: Yes Education handout provided: Yes Muscles treated: right lumbar paraspinals L3-L4 and L5 .30x50 for each  Electrical stimulation performed: No Parameters: N/A Treatment response/outcome: great twtich    Manual: trigger point dry needling to lumbar spine; roller to IT band; LAD to right LE  Reviewed self LAD using band and door   Reviewed technique with gym activity row 2x15 10 lbs with cuing for breathing with all  Shoulder extension 2x15 10 lbs   Hip abduction 55 lbs with abdominal breathing  Single leg stance cone drill x10 each leg      4/11  Piriformis stretch 3x30sec Child pose stretch x10 5 second hold Child pose sidebending x10 5 sec hold Standing glute med mob with tennis ball x20 sec  PATIENT EDUCATION:  Education details: POC, Symptom management, HEP, benefits and risks of TPDN  Person educated: Patient Education method: Explanation, Demonstration, Tactile cues, Verbal cues, and Handouts Education comprehension: verbalized understanding, returned demonstration, verbal cues required, tactile cues required, and needs further education  HOME EXERCISE PROGRAM: Access Code: [facility] URL: [email or web address] Date: 03/03/2023 Prepared by: Lorayne Bender  Exercises - Supine Piriformis Stretch with Foot on Ground  - 1 x daily - 7 x weekly - 3 sets - 10 reps - Child's Pose Stretch  - 1 x daily - 7 x weekly - 3 sets - 10 reps - Child's Pose with Sidebending  - 1 x daily - 7 x weekly -  3 sets - 10 reps - Standing Glute Med Mobilization with Small Ball on Wall  - 1 x daily - 7 x weekly - 3 sets - 10 reps  ASSESSMENT:  CLINICAL IMPRESSION: The patient is progressing well. She has had very little pain. She continues to have a trigger point in her gluteal but it is improving. She also had a trigger point in her lumbar spine today. We performed needling and soft tissue mobilization on both We reviewed technique with squats and dead lifts. We have one more visit scheduled. We will re-assess plan next visit.   OBJECTIVE IMPAIRMENTS: decreased activity tolerance and decreased endurance.   ACTIVITY LIMITATIONS: carrying, lifting, bending, sitting, and squatting  PARTICIPATION LIMITATIONS: meal prep, cleaning, laundry, shopping, community activity, occupation, and yard work  PERSONAL FACTORS: 1 comorbidity: Lumbar facet arthropathy  are also affecting patient's functional outcome.   REHAB POTENTIAL: Excellent  CLINICAL DECISION MAKING: Stable/uncomplicated  EVALUATION COMPLEXITY: Low   GOALS: Goals reviewed with patient? Yes  SHORT TERM GOALS: Target date: 4/24  Patient will be independent HEP. Baseline: Goal status: INITIAL  2.  Patient will increase strength bilateral by 5lbs Baseline:  Goal status: INITIAL  3.  Patient will report a 50% reduction in intensity and frequency of symptoms Baseline:  Goal status: INITIAL    LONG TERM GOALS: Target date: 03/31/23  Patient will be able to do her yoga sessions without discomfort. Baseline:  Goal status: INITIAL  2.  Patient will be able to extend and flex without end range tightness in order to pick items up without pain .  Baseline:  Goal status: INITIAL   PLAN:  PT FREQUENCY: 1x/week  PT DURATION: 6 weeks  PLANNED INTERVENTIONS: Therapeutic exercises, Therapeutic activity, Neuromuscular re-education, Balance training, Gait training, Patient/Family education, Self Care, Joint mobilization, Joint  manipulation, Aquatic Therapy, Dry Needling, Electrical stimulation, Cryotherapy, Moist heat, Ultrasound, Ionotophoresis 4mg /ml Dexamethasone, and Manual therapy.  PLAN FOR NEXT SESSION: Consider quadruped series, LTR, clamshells, single/double leg bridge, SLR. Consider STM on glute or dry needling. Review technique with gym activity  PHYSICAL THERAPY DISCHARGE SUMMARY  Visits from Start of Care: 4  Current functional level related to goals / functional outcomes: Improved pain   Remaining deficits: Mild tingling at times   Education / Equipment: HEP    Patient agrees to discharge. Patient goals were met. Patient is being discharged due to meeting the stated rehab goals.   Dessie Coma, PT 03/25/2023, 1:00 PM

## 2023-03-31 ENCOUNTER — Encounter
Payer: BC Managed Care – PPO | Attending: Physical Medicine & Rehabilitation | Admitting: Physical Medicine & Rehabilitation

## 2023-03-31 ENCOUNTER — Encounter: Payer: Self-pay | Admitting: Physical Medicine & Rehabilitation

## 2023-03-31 VITALS — BP 130/79 | HR 75 | Ht 66.5 in | Wt 155.0 lb

## 2023-03-31 DIAGNOSIS — M47816 Spondylosis without myelopathy or radiculopathy, lumbar region: Secondary | ICD-10-CM | POA: Insufficient documentation

## 2023-03-31 NOTE — Patient Instructions (Signed)
ALWAYS FEEL FREE TO CALL OUR OFFICE WITH ANY PROBLEMS OR QUESTIONS [phone]  **PLEASE NOTE** ALL MEDICATION REFILL REQUESTS (INCLUDING CONTROLLED SUBSTANCES) NEED TO BE MADE AT LEAST 7 DAYS PRIOR TO REFILL BEING DUE. ANY REFILL REQUESTS INSIDE THAT TIME FRAME MAY RESULT IN DELAYS IN RECEIVING YOUR PRESCRIPTION.    Continue with your daily stretches. Stay active and exercising  but be sensible about what you do.

## 2023-03-31 NOTE — Progress Notes (Signed)
Subjective:    Patient ID: Dawn Bonilla, female    DOB: [birth year], 62 y.o.   MRN: [id number]  HPI  Dawn Bonilla is here in follow up of her chronic pain. She had good results with therapy. She particularly liked the dry needling. She is doing stretches athome  She has ways to stretch to deal with particular problems. She feels that stretching is helping also. She has started supplements including tart cherry, turmeric/curcumin and began diclofenac oral. These have helped her hands.   We ordered xrays of both of her hands.  Right: 1. Moderate thumb carpometacarpal osteoarthritis. 2. Mild-to-moderate interphalangeal osteoarthritis, greatest within the third DIP joint.  Left: 1. Severe thumb carpometacarpal osteoarthritis. 2. Mild-to-moderate interphalangeal osteoarthritis, greatest within the DIP joints.  At this point she feels that her hands are manageable with the above regimen. She had questions about what she could do from an activity standpoint, what they should avoid.   Pain Inventory Average Pain 3 Pain Right Now 1 My pain is intermittent, dull, and stabbing  In the last 24 hours, has pain interfered with the following? General activity 0 Relation with others 0 Enjoyment of life 0 What TIME of day is your pain at its worst? daytime Sleep (in general) Good  Pain is worse with: walking and standing Pain improves with: therapy/exercise and medication Relief from Meds: 7  Family History  Problem Relation Age of Onset   Hypertension Mother    Breast cancer Mother 51   Diabetes Mother        AODM   Lymphoma Mother    Hypertension Father    Heart disease Father    Hypertension Brother    Heart attack Brother 9   Breast cancer Sister 39   Osteoporosis Sister    Breast cancer Maternal Grandmother 47   Diabetes Maternal Grandmother    Social History   Socioeconomic History   Marital status: Married    Spouse name: Not on file   Number of children: Not on file    Years of education: Not on file   Highest education level: Not on file  Occupational History   Not on file  Tobacco Use   Smoking status: Never   Smokeless tobacco: Never  Vaping Use   Vaping Use: Never used  Substance and Sexual Activity   Alcohol use: Yes    Alcohol/week: 6.0 standard drinks of alcohol    Types: 3 Glasses of wine, 3 Standard drinks or equivalent per week    Comment: 3 glasses of wine per week   Drug use: No   Sexual activity: Yes    Partners: Male    Birth control/protection: Other-see comments, Post-menopausal    Comment: vasectomy  Other Topics Concern   Not on file  Social History Narrative   Not on file   Social Determinants of Health   Financial Resource Strain: Not on file  Food Insecurity: Not on file  Transportation Needs: Not on file  Physical Activity: Not on file  Stress: Not on file  Social Connections: Not on file   Past Surgical History:  Procedure Laterality Date   CESAREAN SECTION  1995   TONSILLECTOMY AND ADENOIDECTOMY     age 28   Past Surgical History:  Procedure Laterality Date   CESAREAN SECTION  1995   TONSILLECTOMY AND ADENOIDECTOMY     age 9   Past Medical History:  Diagnosis Date   Bulging lumbar disc    Ht 5' 6.5" (1.689  m)   LMP 04/24/2011   BMI 22.26 kg/m   Opioid Risk Score:   Fall Risk Score:  `1  Depression screen [facility] 2/9     03/31/2023    2:13 PM 01/27/2023    1:17 PM  Depression screen PHQ 2/9  Decreased Interest 0 0  Down, Depressed, Hopeless 0 0  PHQ - 2 Score 0 0  Altered sleeping  0  Tired, decreased energy  0  Change in appetite  0  Feeling bad or failure about yourself   0  Trouble concentrating  0  Moving slowly or fidgety/restless  0  Suicidal thoughts  0  PHQ-9 Score  0     Review of Systems  Musculoskeletal:        RT leg buttock pain  All other systems reviewed and are negative.      Objective:   Physical Exam  General: No acute distress HEENT: NCAT, EOMI, oral membranes  moist Cards: reg rate  Chest: normal effort Abdomen: Soft, NT, ND Skin: dry, intact Extremities: no edema Psych: pleasant and appropriate  Skin: intact Neuro: Alert and oriented x 3. Normal insight and awareness. Intact Memory. Normal language and speech. Cranial nerve exam unremarkable. Motor 5/5 except for grip in left hand. Musculoskeletal: mild low back pain with palpation. Lumbar paraspinals less tight. Mild pain and resistance with facet maneuver to right. Extension did no cause a lot of pain. She flexed easily. Mild pain in hand/thumbs.               Assessment & Plan:    Chronic low back pain consistent with lumbar facet arthropathy at L4-L5.  Patient also with generalized lumbar spondylosis by report. Right buttock pain consistent with piriformis pain.  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 Xrays confirm     Plan   Made referral to radiology at [facility] for x-rays of lumbar spine to assess Completed outpatient physical therapy and [address]. Maintain HEP,stretches. She will also look at dry needling.  Diclofenac can continue.  Would like her to move to more of a prn use over the summer depending upon how she's feeling Continue turmeric with curcumin, garlic, tart cherry extract as it appears they are helping.  Would like a copy of her MRI from [facility] orthopedics. Will request Discussed that she needs to be sensible about activities with her back and hands. I want her to stay active though  Twenty minutes of face to face patient care time were spent during this visit. All questions were encouraged and answered.  Follow up with me in 3 mos .

## 2023-04-01 ENCOUNTER — Encounter (HOSPITAL_BASED_OUTPATIENT_CLINIC_OR_DEPARTMENT_OTHER): Payer: BC Managed Care – PPO | Admitting: Physical Therapy

## 2023-04-05 ENCOUNTER — Ambulatory Visit (HOSPITAL_BASED_OUTPATIENT_CLINIC_OR_DEPARTMENT_OTHER)
Admission: RE | Admit: 2023-04-05 | Discharge: 2023-04-05 | Disposition: A | Discharge status: Home / Self Care | Payer: BC Managed Care – PPO | Source: Ambulatory Visit | Attending: Physical Medicine & Rehabilitation | Admitting: Physical Medicine & Rehabilitation

## 2023-04-05 DIAGNOSIS — M47816 Spondylosis without myelopathy or radiculopathy, lumbar region: Secondary | ICD-10-CM | POA: Insufficient documentation

## 2023-04-05 DIAGNOSIS — M5137 Other intervertebral disc degeneration, lumbosacral region: Secondary | ICD-10-CM | POA: Diagnosis not present

## 2023-05-20 ENCOUNTER — Other Ambulatory Visit: Payer: Self-pay | Admitting: Physical Medicine & Rehabilitation

## 2023-05-20 DIAGNOSIS — M7918 Myalgia, other site: Secondary | ICD-10-CM

## 2023-05-20 DIAGNOSIS — M47816 Spondylosis without myelopathy or radiculopathy, lumbar region: Secondary | ICD-10-CM

## 2023-06-09 ENCOUNTER — Encounter (HOSPITAL_BASED_OUTPATIENT_CLINIC_OR_DEPARTMENT_OTHER): Payer: Self-pay | Admitting: Physical Therapy

## 2023-07-07 ENCOUNTER — Ambulatory Visit: Payer: BC Managed Care – PPO | Admitting: Physical Medicine & Rehabilitation

## 2023-07-14 ENCOUNTER — Encounter
Payer: BC Managed Care – PPO | Attending: Physical Medicine & Rehabilitation | Admitting: Physical Medicine & Rehabilitation

## 2023-07-14 ENCOUNTER — Encounter: Payer: Self-pay | Admitting: Physical Medicine & Rehabilitation

## 2023-07-14 VITALS — BP 129/84 | HR 71 | Ht 66.5 in | Wt 155.6 lb

## 2023-07-14 DIAGNOSIS — M5416 Radiculopathy, lumbar region: Secondary | ICD-10-CM | POA: Diagnosis not present

## 2023-07-14 DIAGNOSIS — M47816 Spondylosis without myelopathy or radiculopathy, lumbar region: Secondary | ICD-10-CM | POA: Diagnosis not present

## 2023-07-14 DIAGNOSIS — M1990 Unspecified osteoarthritis, unspecified site: Secondary | ICD-10-CM | POA: Insufficient documentation

## 2023-07-14 MED ORDER — DULOXETINE HCL 30 MG PO CPEP: 90.0000 mg | ORAL_CAPSULE | Freq: Every day | ORAL | 3 refills | Status: DC

## 2023-07-14 NOTE — Progress Notes (Signed)
Subjective:    Patient ID: Dawn Bonilla, female    DOB: 1961-11-23, 62 y.o.   MRN: [id number]  HPI  Mrs Petak is here in follow up of her chronic pain. She is still having pain in multiple areas. A lot of her pain is in her low back radiating down her right leg. We reviewed her xrays:   Minimal retrolisthesis of L3 on L4. Alignment is otherwise anatomic. Vertebral body heights are maintained. Mild multilevel endplate degenerative changes. Loss of disc space height at L4-5 and L5-S1. Mid and lower lumbar facet hypertrophy. No definite pars defects.  She has had injections with Dr. Maurice Small but they have been facet injections only if she recalls correctly.  She backed off diclofenac a bit recently based on our discussion but feels the difference. She remains on cymbalta daily. Rodnika stretches daily and generally stays active.    Pain Inventory Average Pain 3 Pain Right Now 2 My pain is intermittent, dull, and stabbing  In the last 24 hours, has pain interfered with the following? General activity 0 Relation with others 0 Enjoyment of life 0 What TIME of day is your pain at its worst? daytime Sleep (in general) Good  Pain is worse with: walking, standing, and some activites Pain improves with: rest and medication Relief from Meds: 6  Family History  Problem Relation Age of Onset   Hypertension Mother    Breast cancer Mother 38   Diabetes Mother        AODM   Lymphoma Mother    Hypertension Father    Heart disease Father    Hypertension Brother    Heart attack Brother 23   Breast cancer Sister 8   Osteoporosis Sister    Breast cancer Maternal Grandmother 20   Diabetes Maternal Grandmother    Social History   Socioeconomic History   Marital status: Married    Spouse name: Not on file   Number of children: Not on file   Years of education: Not on file   Highest education level: Not on file  Occupational History   Not on file  Tobacco Use   Smoking status:  Never   Smokeless tobacco: Never  Vaping Use   Vaping status: Never Used  Substance and Sexual Activity   Alcohol use: Yes    Alcohol/week: 6.0 standard drinks of alcohol    Types: 3 Glasses of wine, 3 Standard drinks or equivalent per week    Comment: 3 glasses of wine per week   Drug use: No   Sexual activity: Yes    Partners: Male    Birth control/protection: Other-see comments, Post-menopausal    Comment: vasectomy  Other Topics Concern   Not on file  Social History Narrative   Not on file   Social Determinants of Health   Financial Resource Strain: Not on file  Food Insecurity: Not on file  Transportation Needs: Not on file  Physical Activity: Not on file  Stress: Not on file  Social Connections: Not on file   Past Surgical History:  Procedure Laterality Date   CESAREAN SECTION  1995   TONSILLECTOMY AND ADENOIDECTOMY     age 28   Past Surgical History:  Procedure Laterality Date   CESAREAN SECTION  1995   TONSILLECTOMY AND ADENOIDECTOMY     age 80   Past Medical History:  Diagnosis Date   Bulging lumbar disc    BP 129/84   Pulse 71   Ht 5' 6.5" (1.689  m)   Wt 155 lb 9.6 oz (70.6 kg)   LMP 04/24/2011   SpO2 95%   BMI 24.74 kg/m   Opioid Risk Score:   Fall Risk Score:  `1  Depression screen [facility] 2/9     07/14/2023   10:59 AM 03/31/2023    2:13 PM 01/27/2023    1:17 PM  Depression screen PHQ 2/9  Decreased Interest 0 0 0  Down, Depressed, Hopeless 0 0 0  PHQ - 2 Score 0 0 0  Altered sleeping   0  Tired, decreased energy   0  Change in appetite   0  Feeling bad or failure about yourself    0  Trouble concentrating   0  Moving slowly or fidgety/restless   0  Suicidal thoughts   0  PHQ-9 Score   0    Review of Systems  Constitutional: Negative.   HENT: Negative.    Eyes: Negative.   Respiratory: Negative.    Cardiovascular: Negative.   Gastrointestinal: Negative.   Endocrine: Negative.   Genitourinary: Negative.   Musculoskeletal:   Positive for back pain.  Skin: Negative.   Allergic/Immunologic: Negative.   Neurological: Negative.   Hematological: Negative.   Psychiatric/Behavioral: Negative.    All other systems reviewed and are negative.      Objective:   Physical Exam  General: No acute distress HEENT: NCAT, EOMI, oral membranes moist Cards: reg rate  Chest: normal effort Abdomen: Soft, NT, ND Skin: dry, intact Extremities: no edema Psych: pleasant and appropriate   Skin: intact Neuro: Alert and oriented x 3. Normal insight and awareness. Intact Memory. Normal language and speech. Cranial nerve exam unremarkable. Motor 5/5 except for grip in left hand. Musculoskeletal: mild low back pain with palpation. Lumbar paraspinals sl tight. Still mile dextroscoliosis..  . Extension did no cause a lot of pain nor did facets today. She flexed easily. Mild pain in hand/thumbs.                Assessment & Plan:    Chronic low back pain consistent with lumbar facet arthropathy at L4-L5.  Patient also with generalized lumbar spondylosis.  Right buttock pain consistent with piriformis pain.  May be referral pattern from lumbar facets as well but with recent pain down right leg she may be dealing with a radiculopathy also.  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 Xrays confirm     Plan   Reviewed xrays with patient. They are consistent with MRI report. Might benefit from an ESI. Need to see Dr. Dolores Patty procedure reports.  Completed outpatient physical therapy and [address]. Maintain HEP,stretches. She will also look at dry needling.  Diclofenac can continue BID Continue turmeric with curcumin, garlic, tart cherry extract as it appears they are helping.  Would like a copy of her MRI from [facility] orthopedics. Will request Discussed that she needs to be sensible about activities with her back and hands. I want her to stay active though   Twenty  minutes of face to face patient care time were spent during this visit. All questions were encouraged and answered.  Follow up with me in 3 mos .

## 2023-07-14 NOTE — Patient Instructions (Signed)
ALWAYS FEEL FREE TO CALL OUR OFFICE WITH ANY PROBLEMS OR QUESTIONS ([phone])  **PLEASE NOTE** ALL MEDICATION REFILL REQUESTS (INCLUDING CONTROLLED SUBSTANCES) NEED TO BE MADE AT LEAST 7 DAYS PRIOR TO REFILL BEING DUE. ANY REFILL REQUESTS INSIDE THAT TIME FRAME MAY RESULT IN DELAYS IN RECEIVING YOUR PRESCRIPTION.                    

## 2023-08-30 DIAGNOSIS — Z1231 Encounter for screening mammogram for malignant neoplasm of breast: Secondary | ICD-10-CM | POA: Diagnosis not present

## 2023-09-09 ENCOUNTER — Other Ambulatory Visit: Payer: Self-pay | Admitting: Physical Medicine & Rehabilitation

## 2023-09-09 DIAGNOSIS — M47816 Spondylosis without myelopathy or radiculopathy, lumbar region: Secondary | ICD-10-CM

## 2023-09-09 DIAGNOSIS — M7918 Myalgia, other site: Secondary | ICD-10-CM

## 2023-10-04 DIAGNOSIS — L821 Other seborrheic keratosis: Secondary | ICD-10-CM | POA: Diagnosis not present

## 2023-10-04 DIAGNOSIS — D225 Melanocytic nevi of trunk: Secondary | ICD-10-CM | POA: Diagnosis not present

## 2023-10-04 DIAGNOSIS — L820 Inflamed seborrheic keratosis: Secondary | ICD-10-CM | POA: Diagnosis not present

## 2023-10-04 DIAGNOSIS — L814 Other melanin hyperpigmentation: Secondary | ICD-10-CM | POA: Diagnosis not present

## 2023-10-04 DIAGNOSIS — Z808 Family history of malignant neoplasm of other organs or systems: Secondary | ICD-10-CM | POA: Diagnosis not present

## 2023-10-06 ENCOUNTER — Telehealth: Payer: Self-pay | Admitting: Physical Medicine & Rehabilitation

## 2023-10-06 NOTE — Telephone Encounter (Signed)
Called patient to go over pre-procedure for [facility] mailed copy to patient home address on file

## 2023-10-06 NOTE — Telephone Encounter (Signed)
I spoke to Mrs. Dawn Bonilla today after reviewing all of her ortho information/exams. She would like to pursue an L4-L5 translaminar ESI with Dr. Wynn Banker at his next available. She is having radicular symptoms in both legs currently.   We can cancel her appt with me next week.   Thanks!

## 2023-10-06 NOTE — Telephone Encounter (Signed)
I have scheduled patient for the injection with Dr. Wynn Banker for January.  Can clinical staff please call patient to do pre-procedure sheet with her?

## 2023-10-12 DIAGNOSIS — Z79899 Other long term (current) drug therapy: Secondary | ICD-10-CM | POA: Diagnosis not present

## 2023-10-12 DIAGNOSIS — E785 Hyperlipidemia, unspecified: Secondary | ICD-10-CM | POA: Diagnosis not present

## 2023-10-12 DIAGNOSIS — Z0000 Encounter for general adult medical examination without abnormal findings: Secondary | ICD-10-CM | POA: Diagnosis not present

## 2023-10-12 DIAGNOSIS — E559 Vitamin D deficiency, unspecified: Secondary | ICD-10-CM | POA: Diagnosis not present

## 2023-10-13 ENCOUNTER — Telehealth: Payer: Self-pay | Admitting: Physical Medicine & Rehabilitation

## 2023-10-13 ENCOUNTER — Encounter: Payer: BC Managed Care – PPO | Admitting: Physical Medicine & Rehabilitation

## 2023-10-13 NOTE — Telephone Encounter (Signed)
Documenting Mrs. Trimarchi' prior injections at [facility]:  3/19 Right SIJ 2/18 Right L4-5, L5-S1 facets 4/17 Right L4-5, L5-S1 facets 10/14 Left L5-S1 trans LESI 9/14 Left greater trochanter 8/13 Left L4-5, L5-S1 facets 5/13 Right SIJ   MRI from 01/2023 L3-4 Mild with facet disease on right L4-5 bilateral facet disease with anterolisthesis/bulgin, advanced stenosis L5-S1 Left paracentral disc protrusion, bilateral facet disease with L>R subarticular recess narrowing potentially involving S1 nerve roots.

## 2023-10-14 ENCOUNTER — Encounter: Payer: Self-pay | Admitting: Physical Medicine & Rehabilitation

## 2023-11-10 ENCOUNTER — Other Ambulatory Visit: Payer: Self-pay | Admitting: Physical Medicine & Rehabilitation

## 2023-11-25 ENCOUNTER — Encounter
Payer: BC Managed Care – PPO | Attending: Physical Medicine & Rehabilitation | Admitting: Physical Medicine & Rehabilitation

## 2023-11-25 ENCOUNTER — Encounter: Payer: Self-pay | Admitting: Physical Medicine & Rehabilitation

## 2023-11-25 VITALS — BP 136/89 | HR 61 | Temp 98.4°F | Ht 66.5 in | Wt 151.4 lb

## 2023-11-25 DIAGNOSIS — M5416 Radiculopathy, lumbar region: Secondary | ICD-10-CM | POA: Diagnosis not present

## 2023-11-25 MED ORDER — LIDOCAINE HCL 1 % IJ SOLN
5.0000 mL | Freq: Once | INTRAMUSCULAR | Status: AC
  Administered 2023-11-25: 5 mL

## 2023-11-25 MED ORDER — DEXAMETHASONE SODIUM PHOSPHATE 10 MG/ML IJ SOLN
10.0000 mg | Freq: Once | INTRAMUSCULAR | Status: AC
  Administered 2023-11-25: 10 mg

## 2023-11-25 MED ORDER — IOHEXOL 180 MG/ML  SOLN
2.0000 mL | Freq: Once | INTRAMUSCULAR | Status: AC
  Administered 2023-11-25: 2 mL

## 2023-11-25 MED ORDER — LIDOCAINE HCL (PF) 1 % IJ SOLN
2.0000 mL | Freq: Once | INTRAMUSCULAR | Status: AC
  Administered 2023-11-25: 2 mL

## 2023-11-25 NOTE — Progress Notes (Signed)
  PROCEDURE RECORD [facility]   Name: Dawn Bonilla DOB:[birth year] MRN: [id number]  Date:11/25/2023  Physician: Prentice Compton, MD    Nurse/CMA: Shekelia Boutin RN  Allergies:  Allergies  Allergen Reactions   Naproxen Hives    Eyes swell  Other Reaction(s): hives, swelling eyes  Eyes swell; hives; itching  Eyes swell; hives; itching,   Celecoxib Hives   Shellfish-Derived Products Hives and Itching   Meloxicam Hives, Itching, Nausea Only and Rash    Other Reaction(s): hives, Indigestion    Consent Signed: Yes.    Is patient diabetic? No.  CBG today?    Pregnant: No. LMP: Patient's last menstrual period was 04/24/2011. (age 54-55)  Anticoagulants: no Anti-inflammatory: no Antibiotics: no  Procedure: L5-L5 Transforaminal ESI  Position: Prone Start Time: 10:51  End Time: 10:56  Fluoro Time: 34 sec  RN/CMA Aaliyana Fredericks RN ShumakerRN    Time 10:28 11:02    BP 136/89 146/83    Pulse 61 67    Respirations 14 14    O2 Sat 97 97    S/S 6 6    Pain Level 3 3     D/C home with daughter, patient A & O X 3, D/C instructions reviewed, and sits independently.

## 2023-11-25 NOTE — Progress Notes (Signed)
 Right L5-S1 Lumbar transforaminal epidural steroid injection under fluoroscopic guidance with contrast enhancement  Indication: Lumbosacral radiculitis is not relieved by medication management or other conservative care and interfering with self-care and mobility. Pt states she has had hives with shellfish but has tolerated Fluoro guided ESI at Dr Darline office without itching or other side effects.  Informed consent was obtained after describing risk and benefits of the procedure with the patient, this includes bleeding, bruising, infection, paralysis and medication side effects.  The patient wishes to proceed and has given written consent.  Patient was placed in prone position.  The lumbar area was marked and prepped with Betadine.  It was entered with a 25-gauge 1-1/2 inch needle and one mL of 1% lidocaine  was injected into the skin and subcutaneous tissue.  Then a 22-gauge 3.5 in spinal needle was inserted into the Right L5-S1  intervertebral foramen under AP, lateral, and oblique view.  Once needle tip was within the foramen on lateral views an dnor exceeding 6 o clock position on th epedical on AP viewed Isovue 200 was inected x 2ml Then a solution containing one mL of 10 mg per mL dexamethasone  and 2 mL of 1% lidocaine  was injected.  The patient tolerated procedure well.  Post procedure instructions were given.  Please see post procedure form.   We discussed depending on response to this injection , either a translaminar approach can be used or a different level transforaminal injection (ie L4-5 vs S1 )

## 2023-11-25 NOTE — Patient Instructions (Signed)
 You received a RIght L5-S1 epidural steroid injection under fluoroscopic guidance. This is the most accurate way to perform an epidural injection. This injection was performed to relieve thigh or leg or foot pain that may be related to a pinched nerve in the lumbar spine. The local anesthetic injected today may cause numbness in your leg for a couple hours. If it is severe we may need to observe you for 30-60 minutes after the injection. The cortisone medicine injected today may take several days to take full effect. This medicine can also cause facial flushing or feeling of being warm.  This injection may last for days weeks or months. It can be repeated if needed. If it is not effective, another spinal level may need to be injected. Other treatments include medication management as well as physical therapy. In some cases surgery may be an option.

## 2023-11-26 ENCOUNTER — Ambulatory Visit: Payer: BC Managed Care – PPO | Admitting: Physical Medicine & Rehabilitation

## 2023-12-03 DIAGNOSIS — R3915 Urgency of urination: Secondary | ICD-10-CM | POA: Diagnosis not present

## 2023-12-03 DIAGNOSIS — R399 Unspecified symptoms and signs involving the genitourinary system: Secondary | ICD-10-CM | POA: Diagnosis not present

## 2023-12-25 ENCOUNTER — Other Ambulatory Visit: Payer: Self-pay | Admitting: Physical Medicine & Rehabilitation

## 2024-01-12 ENCOUNTER — Ambulatory Visit: Payer: BC Managed Care – PPO | Admitting: Physical Medicine & Rehabilitation

## 2024-01-12 ENCOUNTER — Encounter: Payer: Self-pay | Admitting: Physical Medicine & Rehabilitation

## 2024-01-12 ENCOUNTER — Encounter
Payer: BC Managed Care – PPO | Attending: Physical Medicine & Rehabilitation | Admitting: Physical Medicine & Rehabilitation

## 2024-01-12 VITALS — BP 128/81 | HR 69 | Ht 66.5 in | Wt 154.0 lb

## 2024-01-12 DIAGNOSIS — M47816 Spondylosis without myelopathy or radiculopathy, lumbar region: Secondary | ICD-10-CM | POA: Diagnosis not present

## 2024-01-12 DIAGNOSIS — M5416 Radiculopathy, lumbar region: Secondary | ICD-10-CM | POA: Insufficient documentation

## 2024-01-12 DIAGNOSIS — M7918 Myalgia, other site: Secondary | ICD-10-CM | POA: Diagnosis not present

## 2024-01-12 MED ORDER — DULOXETINE HCL 30 MG PO CPEP: 60.0000 mg | ORAL_CAPSULE | Freq: Every day | ORAL | Status: DC

## 2024-01-12 NOTE — Progress Notes (Unsigned)
 Subjective:    Patient ID: Dawn Bonilla, female    DOB: [birth year], 63 y.o.   MRN: [id number]  HPI  Mrs. Chesmore is here in follow up of her chronic pain.  In January Dr. Wynn Banker performed a right L5-S1 lumbar transforaminal epidural steroid injection and the patient's had nice results with this.  Her pain levels have decreased quite a bit.  She is able to move and bend a lot more easily.  She denies any pain in her legs.  She has some pain still in her right low back and upper buttock area.  She is gone down to 1 diclofenac per day.  She tried the 90 mg of Cymbalta for 3 weeks and did not notice much of a change so she reduced back to 60 mg daily.  She continues have some discomfort in her thumbs of each hand.  She was doing a position in yoga which required weightbearing and significant hyperextension of her wrists and that seemed to trigger more pain for her.  She had seen hand surgery at 1 point while ago.   Pain Inventory Average Pain 3 Pain Right Now 2 My pain is intermittent, dull, and stabbing  In the last 24 hours, has pain interfered with the following? General activity 0 Relation with others 0 Enjoyment of life 0 What TIME of day is your pain at its worst? morning  Sleep (in general) Good  Pain is worse with: walking, standing, and some activites Pain improves with: rest and medication Relief from Meds: 6  Family History  Problem Relation Age of Onset   Hypertension Mother    Breast cancer Mother 1   Diabetes Mother        AODM   Lymphoma Mother    Hypertension Father    Heart disease Father    Hypertension Brother    Heart attack Brother 102   Breast cancer Sister 41   Osteoporosis Sister    Breast cancer Maternal Grandmother 58   Diabetes Maternal Grandmother    Social History   Socioeconomic History   Marital status: Married    Spouse name: Not on file   Number of children: Not on file   Years of education: Not on file   Highest education level: Not  on file  Occupational History   Not on file  Tobacco Use   Smoking status: Never   Smokeless tobacco: Never  Vaping Use   Vaping status: Never Used  Substance and Sexual Activity   Alcohol use: Yes    Alcohol/week: 6.0 standard drinks of alcohol    Types: 3 Glasses of wine, 3 Standard drinks or equivalent per week    Comment: 3 glasses of wine per week   Drug use: No   Sexual activity: Yes    Partners: Male    Birth control/protection: Other-see comments, Post-menopausal    Comment: vasectomy  Other Topics Concern   Not on file  Social History Narrative   Not on file   Social Drivers of Health   Financial Resource Strain: Not on file  Food Insecurity: Not on file  Transportation Needs: Not on file  Physical Activity: Not on file  Stress: Not on file  Social Connections: Not on file   Past Surgical History:  Procedure Laterality Date   CESAREAN SECTION  1995   TONSILLECTOMY AND ADENOIDECTOMY     age 68   Past Surgical History:  Procedure Laterality Date   CESAREAN SECTION  1995  TONSILLECTOMY AND ADENOIDECTOMY     age 58   Past Medical History:  Diagnosis Date   Bulging lumbar disc    BP 128/81   Pulse 69   Ht 5' 6.5" (1.689 m)   Wt 154 lb (69.9 kg)   LMP 04/24/2011   SpO2 98%   BMI 24.48 kg/m   Opioid Risk Score:   Fall Risk Score:  `1  Depression screen [facility] 2/9     01/12/2024   10:15 AM 11/25/2023   10:45 AM 07/14/2023   10:59 AM 03/31/2023    2:13 PM 01/27/2023    1:17 PM  Depression screen PHQ 2/9  Decreased Interest 0 0 0 0 0  Down, Depressed, Hopeless 0 0 0 0 0  PHQ - 2 Score 0 0 0 0 0  Altered sleeping     0  Tired, decreased energy     0  Change in appetite     0  Feeling bad or failure about yourself      0  Trouble concentrating     0  Moving slowly or fidgety/restless     0  Suicidal thoughts     0  PHQ-9 Score     0      Review of Systems  Musculoskeletal:  Positive for back pain.      Objective:   Physical Exam  General:  No acute distress HEENT: NCAT, EOMI, oral membranes moist Cards: reg rate  Chest: normal effort Abdomen: Soft, NT, ND Skin: dry, intact Extremities: no edema Psych: pleasant and appropriate  Skin: intact Neuro: Alert and oriented x 3. Normal insight and awareness. Intact Memory. Normal language and speech. Cranial nerve exam unremarkable. Motor 5/5 except for grip in left hand. Musculoskeletal: mild low back pain with palpation, right more than left near L4-S1 level. Lumbar paraspinals remain sl tight. Still mile dextroscoliosis..  . Extension caused pain in facets today. She flexed easily and touched her toes. She had minimal discomfort with lateral bending and rotation. Mild pain in hand/thumbs at [facility] and MCP's, right more than left.                Assessment & Plan:    Chronic low back pain which is multifactorial with facet and discogenic components. Has had periodic radicular pain. Right buttock pain consistent with piriformis pain.  May be referral pattern from lumbar facets as well but with recent pain down right leg she may be dealing with a radiculopathy also.  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 Xrays confirm     Plan   Patient is had nice results with a right L5-S1  transforaminal epidural steroid injection.  We discussed maintaining regular activity but being sensible about what she does.  She should work on ongoing facet stretches as well as Pilates-based exercises to lengthen her core and strengthen it. There are also other options for injections depending upon her course. Regarding her wrist and thumb pain, I discussed options with her which include injections and use of wrist splints during activities which are more demanding on her hands and wrists.  She has seen orthopedic hand surgery in the past and that could be an option as well for her to follow-up with them.  I do not think she is at a point where she  needs surgery, but it might be good just to get their opinion.   Continue diclofenac 50 mg daily for now.  I did recommend that she transition to as needed use perhaps using in bursts when her pain is worst. Continue turmeric with curcumin, garlic, tart cherry extract as it appears they are helping.    Twenty minutes of face to face patient care time were spent during this visit. All questions were encouraged and answered.  Follow up with me in 4 mos .

## 2024-01-12 NOTE — Patient Instructions (Addendum)
 ALWAYS FEEL FREE TO CALL OUR OFFICE WITH ANY PROBLEMS OR QUESTIONS [phone])  **PLEASE NOTE** ALL MEDICATION REFILL REQUESTS (INCLUDING CONTROLLED SUBSTANCES) NEED TO BE MADE AT LEAST 7 DAYS PRIOR TO REFILL BEING DUE. ANY REFILL REQUESTS INSIDE THAT TIME FRAME MAY RESULT IN DELAYS IN RECEIVING YOUR PRESCRIPTION.     WHEN YOU ARE GOING TO DO SOMETHING WHICH REQUIRES INCREASED TENSION OF GRIP /STRENGTH WITH YOUR HANDS AND WRIST THEN TRY WEARING A SPLINT.

## 2024-01-28 ENCOUNTER — Other Ambulatory Visit: Payer: Self-pay | Admitting: Physical Medicine & Rehabilitation

## 2024-01-28 DIAGNOSIS — M47816 Spondylosis without myelopathy or radiculopathy, lumbar region: Secondary | ICD-10-CM

## 2024-01-28 DIAGNOSIS — M7918 Myalgia, other site: Secondary | ICD-10-CM

## 2024-03-16 ENCOUNTER — Encounter: Payer: Self-pay | Admitting: Physical Medicine & Rehabilitation

## 2024-03-16 DIAGNOSIS — M79642 Pain in left hand: Secondary | ICD-10-CM

## 2024-03-16 DIAGNOSIS — M1990 Unspecified osteoarthritis, unspecified site: Secondary | ICD-10-CM

## 2024-03-17 NOTE — Telephone Encounter (Signed)
 Order placed ortho-hand. Kevin Kuzma

## 2024-04-25 DIAGNOSIS — M180 Bilateral primary osteoarthritis of first carpometacarpal joints: Secondary | ICD-10-CM | POA: Diagnosis not present

## 2024-05-02 DIAGNOSIS — M79645 Pain in left finger(s): Secondary | ICD-10-CM | POA: Diagnosis not present

## 2024-05-02 DIAGNOSIS — M79644 Pain in right finger(s): Secondary | ICD-10-CM | POA: Diagnosis not present

## 2024-05-09 ENCOUNTER — Other Ambulatory Visit: Payer: Self-pay | Admitting: Physical Medicine & Rehabilitation

## 2024-05-10 ENCOUNTER — Encounter
Payer: BC Managed Care – PPO | Attending: Physical Medicine & Rehabilitation | Admitting: Physical Medicine & Rehabilitation

## 2024-05-10 ENCOUNTER — Ambulatory Visit: Payer: BC Managed Care – PPO | Admitting: Physical Medicine & Rehabilitation

## 2024-05-10 ENCOUNTER — Encounter: Payer: Self-pay | Admitting: Physical Medicine & Rehabilitation

## 2024-05-10 VITALS — BP 128/76 | HR 73 | Ht 65.5 in | Wt 154.2 lb

## 2024-05-10 DIAGNOSIS — M5416 Radiculopathy, lumbar region: Secondary | ICD-10-CM | POA: Diagnosis not present

## 2024-05-10 DIAGNOSIS — M19041 Primary osteoarthritis, right hand: Secondary | ICD-10-CM | POA: Diagnosis not present

## 2024-05-10 DIAGNOSIS — M19042 Primary osteoarthritis, left hand: Secondary | ICD-10-CM | POA: Diagnosis not present

## 2024-05-10 DIAGNOSIS — M79645 Pain in left finger(s): Secondary | ICD-10-CM | POA: Diagnosis not present

## 2024-05-10 DIAGNOSIS — M79644 Pain in right finger(s): Secondary | ICD-10-CM | POA: Diagnosis not present

## 2024-05-10 NOTE — Progress Notes (Signed)
 Subjective:    Patient ID: Dawn Bonilla, female    DOB: July 05, 1961, 63 y.o.   MRN: [id number]  HPI  Dawn Bonilla is here in follow up of her chronic pain. She saw ortho hand recently and is having some custom hand splints being made by OT and is using voltaren  gel more consistently which has also helped.  She uses oral diclofenac  only when pain is more severe.  From her standpoint of her back and leg, her pain is fairly well-controlled.  The epidural injection she received in January has helped quite a bit.  She still has pain at times in the right hip into the right leg on occasion.  She does stretches regularly.  She is active with yoga as well.  She asked if it was possible to have another injection if she needed it.  Otherwise Dawn Bonilla stays active at home.  She recently was traveling [place].   Pain Inventory Average Pain 4 Pain Right Now 3 My pain is intermittent and aching  In the last 24 hours, has pain interfered with the following? General activity 2 Relation with others 0 Enjoyment of life 2 What TIME of day is your pain at its worst? daytime Sleep (in general) Good  Pain is worse with: walking and standing Pain improves with: rest, therapy/exercise, and injections Relief from Meds: 7  Family History  Problem Relation Age of Onset   Hypertension Mother    Breast cancer Mother 41   Diabetes Mother        AODM   Lymphoma Mother    Hypertension Father    Heart disease Father    Hypertension Brother    Heart attack Brother 37   Breast cancer Sister 60   Osteoporosis Sister    Breast cancer Maternal Grandmother 9   Diabetes Maternal Grandmother    Social History   Socioeconomic History   Marital status: Married    Spouse name: Not on file   Number of children: Not on file   Years of education: Not on file   Highest education level: Not on file  Occupational History   Not on file  Tobacco Use   Smoking status: Never   Smokeless tobacco: Never  Vaping Use    Vaping status: Never Used  Substance and Sexual Activity   Alcohol use: Yes    Alcohol/week: 6.0 standard drinks of alcohol    Types: 3 Glasses of wine, 3 Standard drinks or equivalent per week    Comment: 3 glasses of wine per week   Drug use: No   Sexual activity: Yes    Partners: Male    Birth control/protection: Other-see comments, Post-menopausal    Comment: vasectomy  Other Topics Concern   Not on file  Social History Narrative   Not on file   Social Drivers of Health   Financial Resource Strain: Not on file  Food Insecurity: Low Risk  (04/25/2024)   Received from [facility]   Hunger Vital Sign    Within the past 12 months, you worried that your food would run out before you got money to buy more: Never true    Within the past 12 months, the food you bought just didn't last and you didn't have money to get more. : Never true  Transportation Needs: No Transportation Needs (04/25/2024)   Received from [facility]    In the past 12 months, has lack of reliable transportation kept you from medical appointments, meetings, work  or from getting things needed for daily living? : No  Physical Activity: Not on file  Stress: Not on file  Social Connections: Not on file   Past Surgical History:  Procedure Laterality Date   CESAREAN SECTION  1995   TONSILLECTOMY AND ADENOIDECTOMY     age 62   Past Surgical History:  Procedure Laterality Date   CESAREAN SECTION  1995   TONSILLECTOMY AND ADENOIDECTOMY     age 89   Past Medical History:  Diagnosis Date   Bulging lumbar disc    BP 128/76   Pulse 73   Ht 5' 5.5 (1.664 m)   Wt 154 lb 3.2 oz (69.9 kg)   LMP 04/24/2011   SpO2 96%   BMI 25.27 kg/m   Opioid Risk Score:   Fall Risk Score:  `1  Depression screen [facility] 2/9     05/10/2024   10:09 AM 01/12/2024   10:15 AM 11/25/2023   10:45 AM 07/14/2023   10:59 AM 03/31/2023    2:13 PM 01/27/2023    1:17 PM  Depression screen PHQ 2/9  Decreased Interest 0 0 0  0 0 0  Down, Depressed, Hopeless 0 0 0 0 0 0  PHQ - 2 Score 0 0 0 0 0 0  Altered sleeping      0  Tired, decreased energy      0  Change in appetite      0  Feeling bad or failure about yourself       0  Trouble concentrating      0  Moving slowly or fidgety/restless      0  Suicidal thoughts      0  PHQ-9 Score      0    Review of Systems  Musculoskeletal:        Bilateral hands, right hip and leg  All other systems reviewed and are negative.      Objective:   Physical Exam General: No acute distress HEENT: NCAT, EOMI, oral membranes moist Cards: reg rate  Chest: normal effort Abdomen: Soft, NT, ND Skin: dry, intact Extremities: no edema Psych: pleasant and appropriate  Skin: intact Neuro: Alert and oriented x 3. Normal insight and awareness. Intact Memory. Normal language and speech. Cranial nerve exam unremarkable. Motor 5/5 except for grip in left hand. Musculoskeletal: Minimal low back pain with palpation, she tends to lean a bit to the right while standing due to her mild dextroscoliosis. Lumbar paraspinals remain sl tight.   Extension seem to cause a bit of pain.. She flexed easily and touched the floor. . Mild pain in hand/thumbs at [facility] and MCP's, right more than left.                Assessment & Plan:    Chronic low back pain which is multifactorial with facet and discogenic components. Has had periodic radicular pain. Right buttock pain which is likely referred pain from her right L5 radiculopathy. 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 Xrays confirm     Plan:   Patient  had nice results with a right L5-S1  transforaminal epidural steroid injection.  - maintain regular activity and sensibility with activity.   - Continue stretching program as she is doing.  She remains active with yoga as well. -Diclofenac  oral as needed -Consider repeat injection later this year if needed. 2.   Continue  recommendations per orthopedic hand surgeon - Using  Voltaren  gel more frequently has helped.  May use oral diclofenac  as needed -Elastic hand splints per  Occupational Therapy  Continue turmeric with curcumin, garlic, tart cherry extract as it appears they are helping.    Twenty minutes of face to face patient care time were spent during this visit. All questions were encouraged and answered.  Follow up with me in 6 mos .

## 2024-05-10 NOTE — Patient Instructions (Signed)
 ALWAYS FEEL FREE TO CALL OUR OFFICE WITH ANY PROBLEMS OR QUESTIONS [phone])  **PLEASE NOTE** ALL MEDICATION REFILL REQUESTS (INCLUDING CONTROLLED SUBSTANCES) NEED TO BE MADE AT LEAST 7 DAYS PRIOR TO REFILL BEING DUE. ANY REFILL REQUESTS INSIDE THAT TIME FRAME MAY RESULT IN DELAYS IN RECEIVING YOUR PRESCRIPTION.

## 2024-06-09 ENCOUNTER — Other Ambulatory Visit: Payer: Self-pay | Admitting: Physical Medicine & Rehabilitation

## 2024-06-09 DIAGNOSIS — M47816 Spondylosis without myelopathy or radiculopathy, lumbar region: Secondary | ICD-10-CM

## 2024-06-09 DIAGNOSIS — M7918 Myalgia, other site: Secondary | ICD-10-CM

## 2024-08-07 ENCOUNTER — Other Ambulatory Visit: Payer: Self-pay | Admitting: Physical Medicine & Rehabilitation

## 2024-08-28 ENCOUNTER — Ambulatory Visit: Admitting: Family Medicine

## 2024-08-30 DIAGNOSIS — Z1231 Encounter for screening mammogram for malignant neoplasm of breast: Secondary | ICD-10-CM | POA: Diagnosis not present

## 2024-08-30 LAB — HM MAMMOGRAPHY

## 2024-09-12 ENCOUNTER — Ambulatory Visit: Payer: Self-pay | Admitting: Family Medicine

## 2024-09-12 ENCOUNTER — Encounter: Payer: Self-pay | Admitting: Family Medicine

## 2024-09-12 ENCOUNTER — Ambulatory Visit (INDEPENDENT_AMBULATORY_CARE_PROVIDER_SITE_OTHER): Admitting: Family Medicine

## 2024-09-12 VITALS — BP 110/78 | HR 70 | Temp 97.6°F | Ht 65.5 in | Wt 147.0 lb

## 2024-09-12 DIAGNOSIS — Z0000 Encounter for general adult medical examination without abnormal findings: Secondary | ICD-10-CM | POA: Diagnosis not present

## 2024-09-12 DIAGNOSIS — E785 Hyperlipidemia, unspecified: Secondary | ICD-10-CM

## 2024-09-12 DIAGNOSIS — Z7689 Persons encountering health services in other specified circumstances: Secondary | ICD-10-CM

## 2024-09-12 DIAGNOSIS — E348 Other specified endocrine disorders: Secondary | ICD-10-CM | POA: Diagnosis not present

## 2024-09-12 DIAGNOSIS — E559 Vitamin D deficiency, unspecified: Secondary | ICD-10-CM | POA: Diagnosis not present

## 2024-09-12 DIAGNOSIS — Z6824 Body mass index (BMI) 24.0-24.9, adult: Secondary | ICD-10-CM

## 2024-09-12 LAB — LIPID PANEL
Cholesterol: 252 mg/dL — ABNORMAL HIGH (ref 0–200)
HDL: 65 mg/dL (ref >39.00)
LDL Cholesterol: 160 mg/dL — ABNORMAL HIGH (ref 0–99)
NonHDL: 186.6
Total CHOL/HDL Ratio: 4
Triglycerides: 132 mg/dL (ref 0.0–149.0)
VLDL: 26.4 mg/dL (ref 0.0–40.0)

## 2024-09-12 LAB — CBC WITH DIFFERENTIAL/PLATELET
Basophils Absolute: 0 K/uL (ref 0.0–0.1)
Basophils Relative: 0.8 % (ref 0.0–3.0)
Eosinophils Absolute: 0.1 K/uL (ref 0.0–0.7)
Eosinophils Relative: 1.3 % (ref 0.0–5.0)
HCT: 41.1 % (ref 36.0–46.0)
Hemoglobin: 13.8 g/dL (ref 12.0–15.0)
Lymphocytes Relative: 31.7 % (ref 12.0–46.0)
Lymphs Abs: 1.9 K/uL (ref 0.7–4.0)
MCHC: 33.5 g/dL (ref 30.0–36.0)
MCV: 86.9 fl (ref 78.0–100.0)
Monocytes Absolute: 0.5 K/uL (ref 0.1–1.0)
Monocytes Relative: 7.6 % (ref 3.0–12.0)
Neutro Abs: 3.6 K/uL (ref 1.4–7.7)
Neutrophils Relative %: 58.6 % (ref 43.0–77.0)
Platelets: 431 K/uL — ABNORMAL HIGH (ref 150.0–400.0)
RBC: 4.73 Mil/uL (ref 3.87–5.11)
RDW: 13.8 % (ref 11.5–15.5)
WBC: 6.1 K/uL (ref 4.0–10.5)

## 2024-09-12 LAB — TSH: TSH: 3.69 u[IU]/mL (ref 0.35–5.50)

## 2024-09-12 LAB — COMPREHENSIVE METABOLIC PANEL WITH GFR
ALT: 21 U/L (ref 0–35)
AST: 23 U/L (ref 0–37)
Albumin: 4.7 g/dL (ref 3.5–5.2)
Alkaline Phosphatase: 38 U/L — ABNORMAL LOW (ref 39–117)
BUN: 14 mg/dL (ref 6–23)
CO2: 26 meq/L (ref 19–32)
Calcium: 10 mg/dL (ref 8.4–10.5)
Chloride: 102 meq/L (ref 96–112)
Creatinine, Ser: 0.73 mg/dL (ref 0.40–1.20)
GFR: 86.47 mL/min (ref >60.00)
Glucose, Bld: 103 mg/dL — ABNORMAL HIGH (ref 70–99)
Potassium: 4.5 meq/L (ref 3.5–5.1)
Sodium: 137 meq/L (ref 135–145)
Total Bilirubin: 0.6 mg/dL (ref 0.2–1.2)
Total Protein: 7.2 g/dL (ref 6.0–8.3)

## 2024-09-12 LAB — VITAMIN D 25 HYDROXY (VIT D DEFICIENCY, FRACTURES): VITD: 33.26 ng/mL (ref 30.00–100.00)

## 2024-09-12 NOTE — Patient Instructions (Signed)
-  It was nice to meet you and look forward to taking care of you. -Ordered bone density scan. Please call the office or send a MyChart message if you do not receive a phone call about appointment in 2 weeks.  -Ordered labs. Office will call with lab results and will be available on MyChart.  -Continue all medications and care with specialist.  -Continue a healthy diet and participate in regular exercise.  -Follow up in 1 year for a physical.

## 2024-09-12 NOTE — Progress Notes (Signed)
 New Patient Office Visit  Subjective   Patient ID: Dawn Bonilla, female    DOB: 10/17/1961  Age: 63 y.o. MRN: [id number]  CC:  Chief Complaint  Patient presents with   Establish Care    HPI Dawn Bonilla presents to establish care with new provider.  Patients previous primary care provider: [facility] at [facility] with Dr. Reena Duck. Last seen: Last December.   Specialist: [facility] with Dr. Arthea Gunther   Patient has no concerns. She reports her cholesterol is usually elevated, but has not been recommended to take medication.  She participates in regular exercise.  Medications are either managed by specialist or OTC. Outpatient Encounter Medications as of 09/12/2024  Medication Sig   cetirizine (ZYRTEC) 10 MG tablet Take by mouth.   diclofenac  (VOLTAREN ) 50 MG EC tablet TAKE 1 TABLET BY MOUTH TWICE A DAY   DULoxetine  (CYMBALTA ) 30 MG capsule TAKE 3 CAPSULES BY MOUTH EVERY DAY   fexofenadine (ALLEGRA) 180 MG tablet Take 180 mg by mouth daily.   ibuprofen (ADVIL,MOTRIN) 200 MG tablet Take 200 mg by mouth every 6 (six) hours as needed for pain.   melatonin 3 MG TABS tablet 1 tablet at bedtime as needed Orally Once a day   Multiple Vitamin (MULTIVITAMIN) capsule Take 1 capsule by mouth daily.   No facility-administered encounter medications on file as of 09/12/2024.    Past Medical History:  Diagnosis Date   Allergy 2010   Arthritis 2009   Bulging lumbar disc    HOH (hard of hearing)    Wears hearing aids   Hyperlipidemia 2020   Osteoarthritis     Past Surgical History:  Procedure Laterality Date   CESAREAN SECTION  1995   TONSILLECTOMY AND ADENOIDECTOMY     age 62    Family History  Problem Relation Age of Onset   Hypertension Mother    Breast cancer Mother 37   Diabetes Mother        AODM   Lymphoma Mother    Arthritis Mother     Cancer Mother    Hypertension Father    Heart disease Father    Arthritis Father    Depression Father    Hearing loss Father    Hypertension Brother    Heart attack Brother 45   Heart disease Brother    Breast cancer Sister 44   Osteoporosis Sister    Arthritis Sister    Depression Sister    Cancer Sister    Breast cancer Maternal Grandmother 76   Diabetes Maternal Grandmother    Cancer Maternal Grandmother    Cancer Paternal Aunt     Social History   Socioeconomic History   Marital status: Married    Spouse name: Not on file   Number of children: 2   Years of education: Not on file   Highest education level: Professional school degree (e.g., MD, DDS, DVM, JD)  Occupational History   Not on file  Tobacco Use   Smoking status: Never   Smokeless tobacco: Never  Vaping Use   Vaping status: Never Used  Substance and Sexual Activity   Alcohol use: Not Currently    Comment: 3 glasses of wine per week   Drug use: No   Sexual activity: Yes    Partners: Male    Birth control/protection: Post-menopausal    Comment: vasectomy  Other Topics Concern   Not  on file  Social History Narrative   Not on file   Social Drivers of Health   Financial Resource Strain: Low Risk  (09/11/2024)   Overall Financial Resource Strain (CARDIA)    Difficulty of Paying Living Expenses: Not hard at all  Food Insecurity: No Food Insecurity (09/11/2024)   Hunger Vital Sign    Worried About Running Out of Food in the Last Year: Never true    Ran Out of Food in the Last Year: Never true  Transportation Needs: No Transportation Needs (09/11/2024)   PRAPARE - Administrator, Civil Service (Medical): No    Lack of Transportation (Non-Medical): No  Physical Activity: Sufficiently Active (09/11/2024)   Exercise Vital Sign    Days of Exercise per Week: 5 days    Minutes of Exercise per Session: 50 min  Stress: No Stress Concern Present (09/11/2024)   [employer] of Occupational  Health - Occupational Stress Questionnaire    Feeling of Stress: Not at all  Social Connections: Moderately Integrated (09/11/2024)   Social Connection and Isolation Panel    Frequency of Communication with Friends and Family: Once a week    Frequency of Social Gatherings with Friends and Family: Once a week    Attends Religious Services: More than 4 times per year    Active Member of [employer] or Organizations: Yes    Attends Engineer, structural: More than 4 times per year    Marital Status: Married  Catering manager Violence: Not At Risk (09/12/2024)   Humiliation, Afraid, Rape, and Kick questionnaire    Fear of Current or Ex-Partner: No    Emotionally Abused: No    Physically Abused: No    Sexually Abused: No    ROS See HPI above    Objective  BP 110/78   Pulse 70   Temp 97.6 F (36.4 C) (Oral)   Ht 5' 5.5 (1.664 m)   Wt 147 lb (66.7 kg)   LMP 04/24/2011   SpO2 95%   BMI 24.09 kg/m   Physical Exam Vitals reviewed.  Constitutional:      General: She is not in acute distress.    Appearance: Normal appearance. She is normal weight. She is not ill-appearing, toxic-appearing or diaphoretic.  HENT:     Head: Normocephalic and atraumatic.     Right Ear: Tympanic membrane, ear canal and external ear normal. Decreased hearing noted. There is no impacted cerumen.     Left Ear: Tympanic membrane, ear canal and external ear normal. Decreased hearing noted. There is no impacted cerumen.     Ears:     Comments: Wears bilateral hearing aids     Nose:     Right Sinus: No maxillary sinus tenderness or frontal sinus tenderness.     Left Sinus: No maxillary sinus tenderness or frontal sinus tenderness.     Mouth/Throat:     Mouth: Mucous membranes are moist.     Pharynx: Oropharynx is clear. Uvula midline. No pharyngeal swelling, oropharyngeal exudate, posterior oropharyngeal erythema or uvula swelling.  Eyes:     General:        Right eye: No discharge.        Left eye:  No discharge.     Conjunctiva/sclera: Conjunctivae normal.     Pupils: Pupils are equal, round, and reactive to light.  Neck:     Thyroid: No thyromegaly.  Cardiovascular:     Rate and Rhythm: Normal rate and regular rhythm.  Pulses:          Posterior tibial pulses are 2+ on the right side and 2+ on the left side.     Heart sounds: Normal heart sounds. No murmur heard.    No friction rub. No gallop.  Pulmonary:     Effort: Pulmonary effort is normal. No respiratory distress.     Breath sounds: Normal breath sounds.  Abdominal:     General: Abdomen is flat. Bowel sounds are normal. There is no distension.     Palpations: Abdomen is soft. There is no mass.     Tenderness: There is no abdominal tenderness.  Musculoskeletal:        General: Normal range of motion.     Cervical back: Normal range of motion.     Right lower leg: No edema.     Left lower leg: No edema.  Lymphadenopathy:     Cervical: No cervical adenopathy.  Skin:    General: Skin is warm and dry.  Neurological:     General: No focal deficit present.     Mental Status: She is alert and oriented to person, place, and time. Mental status is at baseline.     Motor: No weakness.     Gait: Gait normal.  Psychiatric:        Mood and Affect: Mood normal.        Behavior: Behavior normal.        Thought Content: Thought content normal.        Judgment: Judgment normal.      Assessment & Plan:  Annual physical exam -     CBC with Differential/Platelet -     Comprehensive metabolic panel with GFR -     Lipid panel -     TSH  Estradiol deficiency -     DG Bone Density; Future  Vitamin D  deficiency -     VITAMIN D  25 Hydroxy (Vit-D Deficiency, Fractures)  Hyperlipidemia, unspecified hyperlipidemia type -     Lipid panel  BMI 24.0-24.9, adult  Encounter to establish care  1.Review health maintenance: -Covid booster: [facility] [address] [facility] [place] -Dexa scan: Ordered  -HIV and Hep C screening: Probably   -Mammogram: Either last month or this month-[facility]  2.Ordered labs. Office will call with lab results and will be available on MyChart.  3.Continue all medications and care with specialist.  4.Continue a healthy diet and participate in regular exercise.  Return in about 1 year (around 09/12/2025) for physical.   Jenica Costilow, NP

## 2024-10-14 ENCOUNTER — Other Ambulatory Visit: Payer: Self-pay | Admitting: Physical Medicine & Rehabilitation

## 2024-10-14 ENCOUNTER — Emergency Department (HOSPITAL_BASED_OUTPATIENT_CLINIC_OR_DEPARTMENT_OTHER)
Admission: EM | Admit: 2024-10-14 | Discharge: 2024-10-14 | Disposition: A | Discharge status: Home / Self Care | Attending: Emergency Medicine | Admitting: Emergency Medicine

## 2024-10-14 ENCOUNTER — Emergency Department (HOSPITAL_BASED_OUTPATIENT_CLINIC_OR_DEPARTMENT_OTHER): Admitting: Radiology

## 2024-10-14 ENCOUNTER — Other Ambulatory Visit: Payer: Self-pay

## 2024-10-14 DIAGNOSIS — M7918 Myalgia, other site: Secondary | ICD-10-CM

## 2024-10-14 DIAGNOSIS — R0789 Other chest pain: Secondary | ICD-10-CM | POA: Diagnosis not present

## 2024-10-14 DIAGNOSIS — R079 Chest pain, unspecified: Secondary | ICD-10-CM | POA: Diagnosis not present

## 2024-10-14 DIAGNOSIS — M47816 Spondylosis without myelopathy or radiculopathy, lumbar region: Secondary | ICD-10-CM

## 2024-10-14 LAB — TROPONIN T, HIGH SENSITIVITY
Troponin T High Sensitivity: <15 ng/L (ref 0–19)
Troponin T High Sensitivity: <15 ng/L (ref 0–19)

## 2024-10-14 LAB — BASIC METABOLIC PANEL WITH GFR
Anion gap: 13 (ref 5–15)
BUN: 13 mg/dL (ref 8–23)
CO2: 23 mmol/L (ref 22–32)
Calcium: 10.5 mg/dL — ABNORMAL HIGH (ref 8.9–10.3)
Chloride: 102 mmol/L (ref 98–111)
Creatinine, Ser: 0.71 mg/dL (ref 0.44–1.00)
GFR, Estimated: >60 mL/min (ref >60)
Glucose, Bld: 169 mg/dL — ABNORMAL HIGH (ref 70–99)
Potassium: 3.8 mmol/L (ref 3.5–5.1)
Sodium: 138 mmol/L (ref 135–145)

## 2024-10-14 LAB — CBC
HCT: 39.1 % (ref 36.0–46.0)
Hemoglobin: 13 g/dL (ref 12.0–15.0)
MCH: 29.2 pg (ref 26.0–34.0)
MCHC: 33.2 g/dL (ref 30.0–36.0)
MCV: 87.9 fL (ref 80.0–100.0)
Platelets: 368 K/uL (ref 150–400)
RBC: 4.45 MIL/uL (ref 3.87–5.11)
RDW: 13.2 % (ref 11.5–15.5)
WBC: 7.1 K/uL (ref 4.0–10.5)
nRBC: 0 % (ref 0.0–0.2)

## 2024-10-14 NOTE — Discharge Instructions (Addendum)
 It was a pleasure taking care of you today. You were seen in the Emergency Department for chest pain. Your work-up was reassuring. Your CT/Xray/Labs showed no acute abnormality to suggest that this is cardiac in nature. Refer to the attached documentation for further management of your symptoms.  I recommend you follow-up with your PCP to obtain a further workup.  Please return to the ER if you experience chest pain, trouble breathing, intractable nausea/vomiting or any other life threatening illnesses.

## 2024-10-14 NOTE — ED Provider Notes (Signed)
 [facility] EMERGENCY DEPARTMENT AT [facility] Provider Note   CSN: [id number] Arrival date & time: 10/14/24  1412     Patient presents with: Chest Pain   Dawn Bonilla is a 63 y.o. female with past medical history of hyperlipidemia, arthritis who presents emergency department for evaluation of chest pain.  Patient reports she began experiencing chest pain earlier this morning.  She noted the pain was primarily substernal, where she feels her ribs connect.  She took 4 baby aspirin and Pepto-Bismol prior to arrival.  She has no prior cardiac history.  She denies radiation of pain, nausea, vomiting or abdominal pain.  At this time, patient reports her chest pain has completely subsided and she is in no apparent distress.    Chest Pain      Prior to Admission medications   Medication Sig Start Date End Date Taking? Authorizing Provider  cetirizine (ZYRTEC) 10 MG tablet Take by mouth. 07/16/21   [provider]  diclofenac  (VOLTAREN ) 50 MG EC tablet TAKE 1 TABLET BY MOUTH TWICE A DAY 06/09/24   Babs Arthea DASEN, MD  DULoxetine  (CYMBALTA ) 30 MG capsule TAKE 3 CAPSULES BY MOUTH EVERY DAY 08/09/24   Babs Arthea DASEN, MD  fexofenadine (ALLEGRA) 180 MG tablet Take 180 mg by mouth daily.    [provider]  ibuprofen (ADVIL,MOTRIN) 200 MG tablet Take 200 mg by mouth every 6 (six) hours as needed for pain.    [provider]  melatonin 3 MG TABS tablet 1 tablet at bedtime as needed Orally Once a day    [provider]  Multiple Vitamin (MULTIVITAMIN) capsule Take 1 capsule by mouth daily.    [provider]    Allergies: Naproxen, Celecoxib, Shellfish protein-containing drug products, and Meloxicam    Review of Systems  Cardiovascular:  Positive for chest pain.    Updated Vital Signs BP 130/82   Pulse 69   Temp 97.8 F (36.6 C)   Resp 14   LMP 04/24/2011   SpO2 95%   Physical Exam Vitals and nursing note reviewed.   Constitutional:      Appearance: Normal appearance.  HENT:     Head: Normocephalic and atraumatic.     Mouth/Throat:     Mouth: Mucous membranes are moist.  Eyes:     General: No scleral icterus.       Right eye: No discharge.        Left eye: No discharge.     Conjunctiva/sclera: Conjunctivae normal.  Cardiovascular:     Rate and Rhythm: Normal rate and regular rhythm.     Pulses: Normal pulses.  Pulmonary:     Effort: Pulmonary effort is normal.     Breath sounds: Normal breath sounds.  Abdominal:     General: There is no distension.     Tenderness: There is no abdominal tenderness.  Musculoskeletal:        General: No deformity.     Cervical back: Normal range of motion.  Skin:    General: Skin is warm and dry.     Capillary Refill: Capillary refill takes less than 2 seconds.  Neurological:     Mental Status: She is alert.     Motor: No weakness.  Psychiatric:        Mood and Affect: Mood normal.     (all labs ordered are listed, but only abnormal results are displayed) Labs Reviewed  BASIC METABOLIC PANEL WITH GFR - Abnormal; Notable for the following components:  Result Value   Glucose, Bld 169 (*)    Calcium 10.5 (*)    All other components within normal limits  CBC  TROPONIN T, HIGH SENSITIVITY  TROPONIN T, HIGH SENSITIVITY    EKG: EKG Interpretation Date/Time:  Saturday October 14 2024 14:29:15 EST Ventricular Rate:  72 PR Interval:  152 QRS Duration:  70 QT Interval:  388 QTC Calculation: 424 R Axis:   72  Text Interpretation: Normal sinus rhythm Septal infarct , age undetermined T wave abnormality, consider lateral ischemia Abnormal ECG No previous ECGs available Confirmed by Lenor Hollering [phone]) on 10/14/2024 3:25:02 PM  Radiology: [facility] Chest 2 View Result Date: 10/14/2024 CLINICAL DATA:  Chest pain. EXAM: CHEST - 2 VIEW COMPARISON:  None Available. FINDINGS: The heart size and mediastinal contours are within normal limits. No  consolidation, effusion, or pneumothorax is seen. Degenerative changes are present in the thoracic spine. No acute osseous abnormality. IMPRESSION: No active cardiopulmonary disease. Electronically Signed   By: Leita Birmingham M.D.   On: 10/14/2024 16:08     Procedures   Medications Ordered in the ED - No data to display           HEART Score: 2                    Medical Decision Making Amount and/or Complexity of Data Reviewed Labs: ordered. Radiology: ordered.   This patient presents to the ED for concern of chest pain, this involves an extensive number of treatment options, and is a complaint that carries with it a high risk of complications and morbidity.   Differential diagnosis includes: Unstable angina, STEMI, NSTEMI, GERD, upper respiratory infection, heart failure  Co morbidities:  hyperlipidemia   Lab Tests:  I Ordered, and personally interpreted labs.  The pertinent results include: No acute abnormalities  Imaging Studies:  I ordered imaging studies including chest x-ray I independently visualized and interpreted imaging which showed no acute cardiopulmonary abnormality I agree with the radiologist interpretation  Cardiac Monitoring/ECG:  The patient was maintained on a cardiac monitor.  I personally viewed and interpreted the cardiac monitored which showed an underlying rhythm of: Sinus rhythm  Medicines ordered and prescription drug management: Medication not indicated  Test Considered:   none  Critical Interventions:   none  Consultations Obtained: None  Problem List / ED Course:     ICD-10-CM   1. Chest wall pain  R07.89       MDM: 63 year old female who presents emergency department for evaluation of chest pain.  Patient reports chest pain began a few hours prior to her arrival.  She did take 4 baby aspirin and some Pepto-Bismol for the pain.  At this time, patient reports her chest pain has completely subsided.  Patient has no documented  prior cardiac workup or indication of cardiovascular disease.  Physical exam was unremarkable.  Her pain was not reproducible to palpation.  She has no nausea or vomiting and denies any abdominal pain.  Initial troponin is less than 15.  However, due to duration of patient's chest pain, delta troponin is indicated.  Delta Cam shows less than 15.  Chest x-ray shows no acute cardiopulmonary abnormality.  EKG shows normal sinus rhythm with septal infarct, age undetermined.  There are no prior EKGs in the patient's chart to review.  Patient's heart score is 2.  Therefore, I am comfortable with discharging this patient.  Recommendation for patient to follow-up with her PCP for further workup and possible  referral to cardiology.  Patient is agreeable to this recommendation.  Patient vital signs are stable.  Patient is appropriate for discharge at this time.   Dispostion:  After consideration of the diagnostic results and the patients response to treatment, I feel that the patient would benefit from PCP follow-up.   Final diagnoses:  Chest wall pain    ED Discharge Orders     None          Torrence Marry GORMAN DEVONNA 10/14/24 1724    Lenor Hollering, MD 10/14/24 1932

## 2024-10-14 NOTE — ED Triage Notes (Signed)
 Patient states mid sternal chest pain that began several hours ago. Slight improvement in symptoms since it begins.

## 2024-11-08 ENCOUNTER — Encounter: Admitting: Physical Medicine & Rehabilitation

## 2024-11-16 ENCOUNTER — Other Ambulatory Visit: Payer: Self-pay | Admitting: Physical Medicine & Rehabilitation

## 2024-11-24 ENCOUNTER — Other Ambulatory Visit: Payer: Self-pay | Admitting: Medical Genetics

## 2024-11-27 ENCOUNTER — Ambulatory Visit: Admitting: Family Medicine

## 2024-11-27 ENCOUNTER — Ambulatory Visit (INDEPENDENT_AMBULATORY_CARE_PROVIDER_SITE_OTHER): Admitting: Family Medicine

## 2024-11-27 ENCOUNTER — Encounter: Payer: Self-pay | Admitting: Family Medicine

## 2024-11-27 VITALS — BP 126/84 | HR 85 | Temp 98.4°F | Ht 65.5 in | Wt 144.0 lb

## 2024-11-27 DIAGNOSIS — Z0000 Encounter for general adult medical examination without abnormal findings: Secondary | ICD-10-CM

## 2024-11-27 NOTE — Progress Notes (Signed)
 Patient was scheduled as a physical appointment. However, patient had a physical on 09/12/2024 with no concerns then or now. She is not due for a physical until 08/2025.

## 2024-11-28 ENCOUNTER — Ambulatory Visit (INDEPENDENT_AMBULATORY_CARE_PROVIDER_SITE_OTHER)
Admission: RE | Admit: 2024-11-28 | Discharge: 2024-11-28 | Disposition: A | Discharge status: Home / Self Care | Source: Ambulatory Visit | Attending: Family Medicine | Admitting: Family Medicine

## 2024-11-28 DIAGNOSIS — E348 Other specified endocrine disorders: Secondary | ICD-10-CM | POA: Diagnosis not present

## 2024-12-13 ENCOUNTER — Encounter: Payer: Self-pay | Admitting: Physical Medicine & Rehabilitation

## 2024-12-13 ENCOUNTER — Encounter: Attending: Physical Medicine & Rehabilitation | Admitting: Physical Medicine & Rehabilitation

## 2024-12-13 VITALS — BP 117/79 | HR 82 | Ht 65.5 in | Wt 143.2 lb

## 2024-12-13 DIAGNOSIS — M533 Sacrococcygeal disorders, not elsewhere classified: Secondary | ICD-10-CM | POA: Diagnosis not present

## 2024-12-13 DIAGNOSIS — M47816 Spondylosis without myelopathy or radiculopathy, lumbar region: Secondary | ICD-10-CM | POA: Insufficient documentation

## 2024-12-13 DIAGNOSIS — M5416 Radiculopathy, lumbar region: Secondary | ICD-10-CM | POA: Diagnosis not present

## 2024-12-13 NOTE — Patient Instructions (Signed)
" °  VISIT SUMMARY: During your visit, we discussed your chronic back and hand pain. We reviewed your current management strategies and made some adjustments to help improve your symptoms.  YOUR PLAN: -CHRONIC LOW BACK PAIN WITH LUMBAR RADICULOPATHY AND FACET ARTHROPATHY: Chronic low back pain with lumbar radiculopathy and facet arthropathy means you have ongoing lower back pain that sometimes radiates down your leg due to nerve irritation and joint issues. Continue with yoga and regular walking to maintain flexibility and core strength. Stretch before walking, focusing on hamstring stretches, back extensions, and gentle rotational exercises. Avoid activities that involve bending, heavy lifting, or twisting if they worsen your symptoms. Use diclofenac  as needed for acute pain, but limit its use to short durations. Consider repeat lumbar injections if symptoms worsen or before travel. Monitor for any worsening neurological symptoms and seek prompt evaluation if they occur. Follow up in six months, with the option for an earlier evaluation if needed.  -SACROILIAC (SI) JOINT DYSFUNCTION: Sacroiliac joint dysfunction means there is pain in the joint between your spine and pelvis. Continue with your current management, including stretching and modifying activities as needed. No new interventions are planned, but we will reassess if your symptoms change.  INSTRUCTIONS: Follow up in six months, or sooner if your symptoms worsen. Monitor for any progressive neurological symptoms such as worsening numbness, loss of strength, or impaired dorsiflexion, and seek prompt evaluation if these occur.    Contains text generated by [employer].   "

## 2024-12-13 NOTE — Progress Notes (Signed)
 "  Subjective:    Patient ID: Dawn Bonilla, female    DOB: 10/31/1961, 64 y.o.   MRN: [id number]  HPI  Discussed the use of AI scribe software for clinical note transcription with the patient, who gave verbal consent to proceed.  History of Present Illness Dawn Bonilla is a 64 year old female with chronic lumbar radiculopathy, facet arthropathy, sacroiliac joint pain, and primary osteoarthritis of the hand who presents for follow-up of chronic back and hand pain.  Chronic Low Back Pain and Radiculopathy: - Persistent low back pain, manageable in severity - Uses diclofenac  as needed for symptomatic flares, avoids prolonged use - Lumbar injections performed one year ago provided symptomatic relief - Maintains regular exercise regimen, including yoga, which improves flexibility and pain control - Increased yoga frequency to three times weekly has led to further symptom improvement  Neurogenic Claudication and Lower Extremity Symptoms: - Hip pain radiating to the leg with ambulation - Intermittent numbness in the foot develops after 10 to 15 minutes of walking, resolves with rest - Does not stretch prior to walking - Able to maintain a fair level of activity - No progressive sensory or motor deficits in the lower extremities - No bowel or bladder dysfunction  Hand Pain and Functional Limitation: - Hand pain interferes with writing, but remains able to perform this activity - Uses custom-made nighttime splints, which are helpful in alleviating symptoms - Uses additional hand sleeves infrequently    Pain Inventory Average Pain 4 Pain Right Now 2 My pain is intermittent, dull, and aching  In the last 24 hours, has pain interfered with the following? General activity 0 Relation with others 0 Enjoyment of life 1 What TIME of day is your pain at its worst? daytime Sleep (in general) Good  Pain is worse with: walking and standing Pain improves with: heat/ice, medication, and  massage Relief from Meds: 2  Family History  Problem Relation Age of Onset   Hypertension Mother    Breast cancer Mother 59   Diabetes Mother        AODM   Lymphoma Mother    Arthritis Mother    Cancer Mother    Hypertension Father    Heart disease Father    Arthritis Father    Depression Father    Hearing loss Father    Hypertension Brother    Heart attack Brother 50   Heart disease Brother    Breast cancer Sister 60   Osteoporosis Sister    Arthritis Sister    Depression Sister    Cancer Sister    Breast cancer Maternal Grandmother 77   Diabetes Maternal Grandmother    Cancer Maternal Grandmother    Cancer Paternal Aunt    Social History   Socioeconomic History   Marital status: Married    Spouse name: Not on file   Number of children: 2   Years of education: Not on file   Highest education level: Professional school degree (e.g., MD, DDS, DVM, JD)  Occupational History   Not on file  Tobacco Use   Smoking status: Never   Smokeless tobacco: Never  Vaping Use   Vaping status: Never Used  Substance and Sexual Activity   Alcohol use: Not Currently    Comment: 3 glasses of wine per week   Drug use: No   Sexual activity: Yes    Partners: Male    Birth control/protection: Post-menopausal    Comment: vasectomy  Other Topics Concern  Not on file  Social History Narrative   Not on file   Social Drivers of Health   Tobacco Use: Low Risk (12/13/2024)   Patient History    Smoking Tobacco Use: Never    Smokeless Tobacco Use: Never    Passive Exposure: Not on file  Financial Resource Strain: Low Risk (09/11/2024)   Overall Financial Resource Strain (CARDIA)    Difficulty of Paying Living Expenses: Not hard at all  Food Insecurity: No Food Insecurity (09/11/2024)   Epic    Worried About Programme Researcher, Broadcasting/film/video in the Last Year: Never true    Ran Out of Food in the Last Year: Never true  Transportation Needs: No Transportation Needs (09/11/2024)   Epic    Lack  of Transportation (Medical): No    Lack of Transportation (Non-Medical): No  Physical Activity: Sufficiently Active (09/11/2024)   Exercise Vital Sign    Days of Exercise per Week: 5 days    Minutes of Exercise per Session: 50 min  Stress: No Stress Concern Present (09/11/2024)   [employer] of Occupational Health - Occupational Stress Questionnaire    Feeling of Stress: Not at all  Social Connections: Moderately Integrated (09/11/2024)   Social Connection and Isolation Panel    Frequency of Communication with Friends and Family: Once a week    Frequency of Social Gatherings with Friends and Family: Once a week    Attends Religious Services: More than 4 times per year    Active Member of Clubs or Organizations: Yes    Attends Banker Meetings: More than 4 times per year    Marital Status: Married  Depression (PHQ2-9): Low Risk (11/27/2024)   Depression (PHQ2-9)    PHQ-2 Score: 0  Alcohol Screen: Low Risk (09/11/2024)   Alcohol Screen    Last Alcohol Screening Score (AUDIT): 2  Housing: Low Risk (09/11/2024)   Epic    Unable to Pay for Housing in the Last Year: No    Number of Times Moved in the Last Year: 0    Homeless in the Last Year: No  Utilities: Low Risk (04/25/2024)   Received from [facility]   Utilities    In the past 12 months has the electric, gas, oil, or water company threatened to shut off services in your home? : No  Health Literacy: Adequate Health Literacy (09/12/2024)   B1300 Health Literacy    Frequency of need for help with medical instructions: Never   Past Surgical History:  Procedure Laterality Date   CESAREAN SECTION  1995   TONSILLECTOMY AND ADENOIDECTOMY     age 38   Past Surgical History:  Procedure Laterality Date   CESAREAN SECTION  1995   TONSILLECTOMY AND ADENOIDECTOMY     age 62   Past Medical History:  Diagnosis Date   Allergy 2010   Arthritis 2009   Bulging lumbar disc    HOH (hard of hearing)    Wears hearing  aids   Hyperlipidemia 2020   Osteoarthritis    BP 117/79 (BP Location: Left Arm, Patient Position: Sitting, Cuff Size: Normal)   Pulse 82   Ht 5' 5.5 (1.664 m)   Wt 143 lb 3.2 oz (65 kg)   LMP 04/24/2011   SpO2 98%   BMI 23.47 kg/m   Opioid Risk Score:   Fall Risk Score:  `1  Depression screen [facility] 2/9     11/27/2024    8:06 AM 09/12/2024    7:25 AM 05/10/2024  10:09 AM 01/12/2024   10:15 AM 11/25/2023   10:45 AM 07/14/2023   10:59 AM 03/31/2023    2:13 PM  Depression screen PHQ 2/9  Decreased Interest 0 0 0 0 0 0 0  Down, Depressed, Hopeless 0 0 0 0 0 0 0  PHQ - 2 Score 0 0 0 0 0 0 0  Altered sleeping 0 0       Tired, decreased energy 0 0       Change in appetite 0 0       Feeling bad or failure about yourself  0 0       Trouble concentrating 0 0       Moving slowly or fidgety/restless 0 0       Suicidal thoughts 0 0       PHQ-9 Score 0 0        Difficult doing work/chores Not difficult at all Not difficult at all          Data saved with a previous flowsheet row definition      Review of Systems  Musculoskeletal:        Right hip, thigh pain, right foot pain  All other systems reviewed and are negative.      Objective:   Physical Exam General: No acute distress HEENT: NCAT, EOMI, oral membranes moist Cards: reg rate  Chest: normal effort Abdomen: Soft, NT, ND Skin: dry, intact Extremities: no edema Psych: pleasant and appropriate  Skin: intact Neuro: Alert and oriented x 3. Normal insight and awareness. Intact Memory. Normal language and speech. Cranial nerve exam unremarkable. Motor 5/5 except for grip in left hand. Musculoskeletal: Minimal low back pain with palpation, she tends to lean a bit to the right while standing due to her mild dextroscoliosis. Lumbar paraspinals remain sl tight.   Extension seem to cause a bit of pain.. She flexed easily and touched the floor. . Mild pain in hand/thumbs at [facility] and MCP's, right more than left.                 Assessment & Plan:    Chronic low back pain which is multifactorial with facet and discogenic components. Has had periodic radicular pain. Right buttock pain which is likely referred pain from her right L5 radiculopathy. 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 Xrays confirm     Plan:   Patient has had nice results with a right L5-S1  transforaminal epidural steroid injection.  - maintain regular activity as we've discussed. Pre and mid exercise stretches.    - Continue stretching program as she is doing.  She remains active with yoga as well. -Diclofenac  oral as needed. Can use several days in a row if needed.  -Consider repeat injection this year if needed. She has intermittent radicular and sensory symptoms in the RLE with ambulation. They are not progressing 2.   Continue recommendations per orthopedic hand surgeon - Using Voltaren  gel more frequently has helped.  May use oral diclofenac  as needed -Elastic hand splints per  Occupational Therapy -f/u with ortho as needed   Continue turmeric with curcumin, garlic, tart cherry extract as it appears they are helping.    Twenty minutes of face to face patient care time were spent during this visit. All questions were encouraged and answered.  Follow up with me in 6 mos .              "

## 2025-01-10 ENCOUNTER — Other Ambulatory Visit

## 2025-06-13 ENCOUNTER — Encounter: Admitting: Physical Medicine & Rehabilitation

## 2025-09-13 ENCOUNTER — Encounter: Admitting: Family Medicine
